# Patient Record
Sex: Male | Born: 1955 | Race: White | Hispanic: No | Marital: Married | Smoking: Current every day smoker
Health system: Southern US, Community
[De-identification: ages and names within clinical notes are randomized; demographics above are authoritative.]

## PROBLEM LIST (undated history)

## (undated) DIAGNOSIS — C801 Malignant (primary) neoplasm, unspecified: Secondary | ICD-10-CM

## (undated) DIAGNOSIS — C189 Malignant neoplasm of colon, unspecified: Secondary | ICD-10-CM

## (undated) DIAGNOSIS — J439 Emphysema, unspecified: Secondary | ICD-10-CM

## (undated) DIAGNOSIS — E119 Type 2 diabetes mellitus without complications: Secondary | ICD-10-CM

## (undated) DIAGNOSIS — D510 Vitamin B12 deficiency anemia due to intrinsic factor deficiency: Secondary | ICD-10-CM

## (undated) DIAGNOSIS — J189 Pneumonia, unspecified organism: Secondary | ICD-10-CM

## (undated) HISTORY — PX: OTHER SURGICAL HISTORY: SHX169

## (undated) HISTORY — PX: TONSILLECTOMY: SUR1361

## (undated) HISTORY — PX: BASAL CELL CARCINOMA EXCISION: SHX1214

## (undated) HISTORY — DX: Malignant (primary) neoplasm, unspecified: C80.1

---

## 2012-05-02 HISTORY — PX: BACK SURGERY: SHX140

## 2012-06-06 ENCOUNTER — Ambulatory Visit: Payer: Self-pay | Admitting: Family Medicine

## 2015-06-09 ENCOUNTER — Other Ambulatory Visit: Payer: Self-pay | Admitting: Neurosurgery

## 2015-06-09 DIAGNOSIS — M5021 Other cervical disc displacement,  high cervical region: Secondary | ICD-10-CM

## 2015-06-19 ENCOUNTER — Ambulatory Visit
Admission: RE | Admit: 2015-06-19 | Discharge: 2015-06-19 | Disposition: A | Discharge status: Home / Self Care | Payer: Medicare Other | Source: Ambulatory Visit | Attending: Neurosurgery | Admitting: Neurosurgery

## 2015-06-19 DIAGNOSIS — M5021 Other cervical disc displacement,  high cervical region: Secondary | ICD-10-CM

## 2017-03-27 DIAGNOSIS — J189 Pneumonia, unspecified organism: Secondary | ICD-10-CM | POA: Diagnosis not present

## 2017-04-17 DIAGNOSIS — Z6834 Body mass index (BMI) 34.0-34.9, adult: Secondary | ICD-10-CM | POA: Diagnosis not present

## 2017-04-17 DIAGNOSIS — R030 Elevated blood-pressure reading, without diagnosis of hypertension: Secondary | ICD-10-CM | POA: Diagnosis not present

## 2017-04-17 DIAGNOSIS — G5601 Carpal tunnel syndrome, right upper limb: Secondary | ICD-10-CM | POA: Diagnosis not present

## 2017-04-17 DIAGNOSIS — M9981 Other biomechanical lesions of cervical region: Secondary | ICD-10-CM | POA: Diagnosis not present

## 2017-05-16 DIAGNOSIS — H524 Presbyopia: Secondary | ICD-10-CM | POA: Diagnosis not present

## 2017-05-18 DIAGNOSIS — M654 Radial styloid tenosynovitis [de Quervain]: Secondary | ICD-10-CM | POA: Diagnosis not present

## 2017-05-30 DIAGNOSIS — R05 Cough: Secondary | ICD-10-CM | POA: Diagnosis not present

## 2017-05-30 DIAGNOSIS — J209 Acute bronchitis, unspecified: Secondary | ICD-10-CM | POA: Diagnosis not present

## 2017-10-16 DIAGNOSIS — Z6833 Body mass index (BMI) 33.0-33.9, adult: Secondary | ICD-10-CM | POA: Diagnosis not present

## 2017-10-16 DIAGNOSIS — M9981 Other biomechanical lesions of cervical region: Secondary | ICD-10-CM | POA: Diagnosis not present

## 2017-10-16 DIAGNOSIS — R030 Elevated blood-pressure reading, without diagnosis of hypertension: Secondary | ICD-10-CM | POA: Diagnosis not present

## 2018-02-19 DIAGNOSIS — Z79899 Other long term (current) drug therapy: Secondary | ICD-10-CM | POA: Diagnosis not present

## 2018-02-19 DIAGNOSIS — J9811 Atelectasis: Secondary | ICD-10-CM | POA: Diagnosis not present

## 2018-02-19 DIAGNOSIS — R490 Dysphonia: Secondary | ICD-10-CM | POA: Insufficient documentation

## 2018-02-19 DIAGNOSIS — F1911 Other psychoactive substance abuse, in remission: Secondary | ICD-10-CM | POA: Insufficient documentation

## 2018-02-19 DIAGNOSIS — R0609 Other forms of dyspnea: Secondary | ICD-10-CM | POA: Diagnosis not present

## 2018-02-19 DIAGNOSIS — Z0000 Encounter for general adult medical examination without abnormal findings: Secondary | ICD-10-CM | POA: Diagnosis not present

## 2018-02-19 DIAGNOSIS — M5116 Intervertebral disc disorders with radiculopathy, lumbar region: Secondary | ICD-10-CM

## 2018-02-19 DIAGNOSIS — E538 Deficiency of other specified B group vitamins: Secondary | ICD-10-CM | POA: Diagnosis not present

## 2018-02-19 DIAGNOSIS — Z720 Tobacco use: Secondary | ICD-10-CM | POA: Insufficient documentation

## 2018-02-19 DIAGNOSIS — Z125 Encounter for screening for malignant neoplasm of prostate: Secondary | ICD-10-CM | POA: Diagnosis not present

## 2018-02-19 HISTORY — DX: Intervertebral disc disorders with radiculopathy, lumbar region: M51.16

## 2018-03-02 DIAGNOSIS — C44519 Basal cell carcinoma of skin of other part of trunk: Secondary | ICD-10-CM | POA: Diagnosis not present

## 2018-03-02 DIAGNOSIS — A630 Anogenital (venereal) warts: Secondary | ICD-10-CM | POA: Diagnosis not present

## 2018-03-02 DIAGNOSIS — L570 Actinic keratosis: Secondary | ICD-10-CM | POA: Diagnosis not present

## 2018-03-02 DIAGNOSIS — D485 Neoplasm of uncertain behavior of skin: Secondary | ICD-10-CM | POA: Diagnosis not present

## 2018-03-02 DIAGNOSIS — X32XXXA Exposure to sunlight, initial encounter: Secondary | ICD-10-CM | POA: Diagnosis not present

## 2018-04-03 DIAGNOSIS — C44519 Basal cell carcinoma of skin of other part of trunk: Secondary | ICD-10-CM | POA: Diagnosis not present

## 2018-04-04 DIAGNOSIS — M5116 Intervertebral disc disorders with radiculopathy, lumbar region: Secondary | ICD-10-CM | POA: Diagnosis not present

## 2018-04-04 DIAGNOSIS — E538 Deficiency of other specified B group vitamins: Secondary | ICD-10-CM | POA: Diagnosis not present

## 2018-04-09 DIAGNOSIS — T819XXA Unspecified complication of procedure, initial encounter: Secondary | ICD-10-CM | POA: Diagnosis not present

## 2018-04-16 ENCOUNTER — Ambulatory Visit (INDEPENDENT_AMBULATORY_CARE_PROVIDER_SITE_OTHER): Payer: PPO | Admitting: Urology

## 2018-04-16 ENCOUNTER — Encounter: Payer: Self-pay | Admitting: Urology

## 2018-04-16 VITALS — BP 143/91 | HR 86 | Ht 69.0 in | Wt 217.8 lb

## 2018-04-16 DIAGNOSIS — A630 Anogenital (venereal) warts: Secondary | ICD-10-CM

## 2018-04-16 NOTE — Progress Notes (Signed)
04/16/2018  12:59 PM   Marin Comment April 23, 1956 [id number]  Referring provider: Rusty Aus, MD [facility] [place], [place] [address]  Chief Complaint  Patient presents with  . Establish Care  . Anogenital warts    HPI: Craig Anderson is a 62 y.o. male that presents today in consultation at request of Dr. Sabra Heck for evaluation of genital warts.  - Reports their location as base of penis into thighs  - Self treated them for years but would like to have them treated completely  - No voiding complaints  PMH: Past Medical History:  Diagnosis Date  . Carcinoma ([place])    basil cell   Surgical History: Past Surgical History:  Procedure Laterality Date  . BACK SURGERY  2014  . BASAL CELL CARCINOMA EXCISION    . vocal chord     tumor removal   Home Medications:  Allergies as of 04/16/2018   No Known Allergies     Medication List       Accurate as of April 16, 2018 12:59 PM. Always use your most recent med list.        aspirin EC 81 MG tablet Take by mouth daily.   doxycycline 100 MG capsule Commonly known as:  VIBRAMYCIN Take 100 mg by mouth.   DULoxetine 30 MG capsule Commonly known as:  CYMBALTA Take by mouth daily.   MULTI-VITAMINS Tabs Take by mouth daily.   Potassium 99 MG Tabs Take by mouth daily.   vitamin B-12 1000 MCG tablet Commonly known as:  CYANOCOBALAMIN Take by mouth daily.      Allergies: No Known Allergies  Family History: Family History  Problem Relation Age of Onset  . Kidney cancer Neg Hx   . Kidney failure Neg Hx   . Prostate cancer Neg Hx   . Sickle cell anemia Neg Hx   . Tuberculosis Neg Hx    Social History:  reports that he has quit smoking. He does not have any smokeless tobacco history on file. No history on file for alcohol and drug.  ROS: UROLOGY Frequent Urination?: No Hard to postpone urination?: No Burning/pain with urination?: No Get up at night  to urinate?: Yes Leakage of urine?: No Urine stream starts and stops?: No Trouble starting stream?: No Do you have to strain to urinate?: No Blood in urine?: No Urinary tract infection?: No Sexually transmitted disease?: No Injury to kidneys or bladder?: No Painful intercourse?: No Weak stream?: No Erection problems?: No Penile pain?: No  Gastrointestinal Nausea?: No Vomiting?: No Indigestion/heartburn?: No Diarrhea?: No Constipation?: No  Constitutional Fever: No Night sweats?: No Weight loss?: No Fatigue?: No  Skin Skin rash/lesions?: No Itching?: No  Eyes Blurred vision?: No Double vision?: No  Ears/Nose/Throat Sore throat?: No Sinus problems?: No  Hematologic/Lymphatic Swollen glands?: No Easy bruising?: No  Cardiovascular Leg swelling?: No Chest pain?: No  Respiratory Cough?: No Shortness of breath?: No  Endocrine Excessive thirst?: No  Musculoskeletal Back pain?: No Joint pain?: No  Neurological Headaches?: No Dizziness?: No  Psychologic Depression?: No Anxiety?: No  Physical Exam: BP (!) 143/91 (BP Location: Left Arm, Patient Position: Sitting)   Pulse 86   Ht 5\' 9"  (1.753 m)   Wt 217 lb 12.8 oz (98.8 kg)   BMI 32.16 kg/m   Constitutional:  Alert and oriented, No acute distress. Respiratory: Normal respiratory effort, no increased work of breathing. Clear CV:RRR GU: No CVA tenderness. Uncircumcised phallus. 10-mm pedunculated condylated distal  prepuce  2 additional 5-mm inner prepuce. 5-mm lesion at the frenulum. Multiple bilateral inner thigh condyloma Skin: No rashes, bruises or suspicious lesions. Neurologic: Grossly intact, no focal deficits, moving all 4 extremities. Psychiatric: Normal mood and affect.  Assessment & Plan:    1. Anogenital Warts  - Discussed excision for the warts on his prepuce v. circumcision. Discussed laser ablation for warts on thighs.   - Discussed recurrence with treatment and possible need to  spot treat with dermatology in the future   - Patient would like to schedule excision and ablation.  -Procedure discussed including potential risks of bleeding, infection and the likelihood of recurrent condylomata.  All questions were answered.   Abbie Sons, MD  [facility] [address] [place], [place] [address] [phone]   10-minute discussion with patient regarding treatment options, risks and benefits. The patient had few questions and concerns regarding these options and the long-term effects.  I, [name] , am acting as a scribe for [employer], Ronda Fairly, MD  I, Abbie Sons, MD, have reviewed all documentation for this visit. The documentation on 04/16/18 for the exam, diagnosis, procedures, and orders are all accurate and complete.

## 2018-04-16 NOTE — H&P (View-Only) (Signed)
04/16/2018  12:59 PM   Craig Anderson 1956-02-09 [id number]  Referring provider: Rusty Aus, MD [facility] [place], [place] [address]  Chief Complaint  Patient presents with  . Establish Care  . Anogenital warts    HPI: Craig Anderson is a 62 y.o. male that presents today in consultation at request of Dr. Sabra Heck for evaluation of genital warts.  - Reports their location as base of penis into thighs  - Self treated them for years but would like to have them treated completely  - No voiding complaints  PMH: Past Medical History:  Diagnosis Date  . Carcinoma ([place])    basil cell   Surgical History: Past Surgical History:  Procedure Laterality Date  . BACK SURGERY  2014  . BASAL CELL CARCINOMA EXCISION    . vocal chord     tumor removal   Home Medications:  Allergies as of 04/16/2018   No Known Allergies     Medication List       Accurate as of April 16, 2018 12:59 PM. Always use your most recent med list.        aspirin EC 81 MG tablet Take by mouth daily.   doxycycline 100 MG capsule Commonly known as:  VIBRAMYCIN Take 100 mg by mouth.   DULoxetine 30 MG capsule Commonly known as:  CYMBALTA Take by mouth daily.   MULTI-VITAMINS Tabs Take by mouth daily.   Potassium 99 MG Tabs Take by mouth daily.   vitamin B-12 1000 MCG tablet Commonly known as:  CYANOCOBALAMIN Take by mouth daily.      Allergies: No Known Allergies  Family History: Family History  Problem Relation Age of Onset  . Kidney cancer Neg Hx   . Kidney failure Neg Hx   . Prostate cancer Neg Hx   . Sickle cell anemia Neg Hx   . Tuberculosis Neg Hx    Social History:  reports that he has quit smoking. He does not have any smokeless tobacco history on file. No history on file for alcohol and drug.  ROS: UROLOGY Frequent Urination?: No Hard to postpone urination?: No Burning/pain with urination?: No Get up at night  to urinate?: Yes Leakage of urine?: No Urine stream starts and stops?: No Trouble starting stream?: No Do you have to strain to urinate?: No Blood in urine?: No Urinary tract infection?: No Sexually transmitted disease?: No Injury to kidneys or bladder?: No Painful intercourse?: No Weak stream?: No Erection problems?: No Penile pain?: No  Gastrointestinal Nausea?: No Vomiting?: No Indigestion/heartburn?: No Diarrhea?: No Constipation?: No  Constitutional Fever: No Night sweats?: No Weight loss?: No Fatigue?: No  Skin Skin rash/lesions?: No Itching?: No  Eyes Blurred vision?: No Double vision?: No  Ears/Nose/Throat Sore throat?: No Sinus problems?: No  Hematologic/Lymphatic Swollen glands?: No Easy bruising?: No  Cardiovascular Leg swelling?: No Chest pain?: No  Respiratory Cough?: No Shortness of breath?: No  Endocrine Excessive thirst?: No  Musculoskeletal Back pain?: No Joint pain?: No  Neurological Headaches?: No Dizziness?: No  Psychologic Depression?: No Anxiety?: No  Physical Exam: BP (!) 143/91 (BP Location: Left Arm, Patient Position: Sitting)   Pulse 86   Ht 5\' 9"  (1.753 m)   Wt 217 lb 12.8 oz (98.8 kg)   BMI 32.16 kg/m   Constitutional:  Alert and oriented, No acute distress. Respiratory: Normal respiratory effort, no increased work of breathing. Clear CV:RRR GU: No CVA tenderness. Uncircumcised phallus. 10-mm pedunculated condylated distal  prepuce  2 additional 5-mm inner prepuce. 5-mm lesion at the frenulum. Multiple bilateral inner thigh condyloma Skin: No rashes, bruises or suspicious lesions. Neurologic: Grossly intact, no focal deficits, moving all 4 extremities. Psychiatric: Normal mood and affect.  Assessment & Plan:    1. Anogenital Warts  - Discussed excision for the warts on his prepuce v. circumcision. Discussed laser ablation for warts on thighs.   - Discussed recurrence with treatment and possible need to  spot treat with dermatology in the future   - Patient would like to schedule excision and ablation.  -Procedure discussed including potential risks of bleeding, infection and the likelihood of recurrent condylomata.  All questions were answered.   Abbie Sons, MD  [facility] [address], [place], [place] [address] [phone]   10-minute discussion with patient regarding treatment options, risks and benefits. The patient had few questions and concerns regarding these options and the long-term effects.  I, [name] , am acting as a scribe for [employer], Ronda Fairly, MD  I, Abbie Sons, MD, have reviewed all documentation for this visit. The documentation on 04/16/18 for the exam, diagnosis, procedures, and orders are all accurate and complete.

## 2018-04-20 ENCOUNTER — Telehealth: Payer: Self-pay | Admitting: Radiology

## 2018-04-20 ENCOUNTER — Other Ambulatory Visit: Payer: Self-pay | Admitting: Radiology

## 2018-04-20 DIAGNOSIS — A630 Anogenital (venereal) warts: Secondary | ICD-10-CM

## 2018-04-20 NOTE — Telephone Encounter (Signed)
Discussed the [facility] Information form below with patient over the phone.  [place], [place], [place] [address] Telephone: [phone] Fax: [phone]   Thank you for choosing [facility] for your upcoming surgery!  We are always here to assist in your urological needs.  Please read the following information with specific details for your upcoming appointments related to your surgery. Please contact Ily Denno at [phone] Option 3 with any questions.  The Name of Your Surgery: Escision of penile condylomata, CO2 laser ablation of condyloma of inner thigh Your Surgery Date: 05/08/2018 Your Surgeon: John Giovanni  Please call Same Day Surgery at [phone] between the hours of 1pm-3pm one day prior to your surgery. They will inform you of the time to arrive at Same Day Surgery which is located on the second floor of the [facility].   You will receive a call from the [place] office regarding your appointment with them.  The Pre-Admission Testing office is located at [facility], on the first floor of the [place] [facility] in [place] (office is to the right as you enter through the [employer] of the [facility]). Please have all medications you are currently taking and your insurance card available.   Patient was advised to have nothing to eat or drink after midnight the night prior to surgery except that he may have only water until 2 hours before surgery with nothing to drink within 2 hours of surgery.  The patient states he currently takes aspirin 81mg  intermittently & was informed to hold medication for 7 days prior to surgery beginning on 05/01/2018. Patient's questions were answered and he expressed understanding of these instructions.

## 2018-05-02 HISTORY — PX: PORTACATH PLACEMENT: SHX2246

## 2018-05-03 ENCOUNTER — Encounter
Admission: RE | Admit: 2018-05-03 | Discharge: 2018-05-03 | Disposition: A | Discharge status: Home / Self Care | Payer: PPO | Source: Ambulatory Visit | Attending: Urology | Admitting: Urology

## 2018-05-03 ENCOUNTER — Other Ambulatory Visit: Payer: Self-pay

## 2018-05-03 HISTORY — DX: Pneumonia, unspecified organism: J18.9

## 2018-05-03 NOTE — Patient Instructions (Addendum)
  Your procedure is scheduled on: Tuesday May 08, 2018 Report to Same Day Surgery 2nd floor [facility] Entrance-take elevator on left to 2nd floor.  Check in with surgery information desk.) To find out your arrival time, call [phone] 1:00-3:00 PM on Monday May 07, 2018  Remember: Instructions that are not followed completely may result in serious medical risk, up to and including death, or upon the discretion of your surgeon and anesthesiologist your surgery may need to be rescheduled.    __x__ 1. Do not eat food (including mints, candies, chewing gum) after midnight the night before your procedure. You may drink water up to 2 hours before you are scheduled to arrive at the hospital for your procedure.  Do not drink anything within 2 hours of your scheduled arrival to the hospital.    __x__ 2. No Alcohol for 24 hours before or after surgery.   __x__ 3. No Smoking or e-cigarettes for 24 hours before surgery.  Do not use any chewable tobacco products for at least 6 hours before surgery.   __x__ 4. Notify your doctor if there is any change in your medical condition (cold, fever, infections).   __x__ 5. On the morning of surgery brush your teeth with toothpaste and water.  You may rinse your mouth with mouthwash if you wish.  Do not swallow any toothpaste or mouthwash.   __x__  Use antibacterial soap such as Dial to shower/bathe on the day of surgery.   Do not wear jewelry on the day of surgery.  Do not wear lotions, powders, deodorant, or perfumes.   Do not shave below the face/neck 48 hours prior to surgery.   Do not bring valuables to the hospital.    [facility] is not responsible for any belongings or valuables.               Glasses, contacts, dentures or bridgework may not be worn into surgery.  For patients discharged on the day of surgery, you will NOT be permitted to drive yourself home.  You must have a responsible adult with you for 24 hours after  surgery.  __x__ Take these medications with a SMALL SIP OF WATER on the morning of surgery:  1. Duloxetine (Cymbalta)  __x__ Follow recommendations from Cardiologist, Pulmonologist or PCP regarding stopping blood thinners such as Aspirin, Coumadin, Plavix, Eliquis, Effient, Pradaxa, and Pletal.  __x__ Stop Anti-inflammatories such as Advil, Ibuprofen, Motrin, Aleve, Naproxen, Naprosyn, BC/Goodies powders or aspirin products. You may continue to take Tylenol and Celebrex. (Already stopped aspirin 04/30/2018)   __x__  Avoid supplements until after surgery. You may continue to take Vitamin D, Vitamin B, and multivitamin.

## 2018-05-07 MED ORDER — CEFAZOLIN SODIUM-DEXTROSE 2-4 GM/100ML-% IV SOLN
2.0000 g | INTRAVENOUS | Status: AC
  Administered 2018-05-08: 2 g via INTRAVENOUS

## 2018-05-08 ENCOUNTER — Ambulatory Visit: Payer: PPO | Admitting: Anesthesiology

## 2018-05-08 ENCOUNTER — Encounter: Payer: Self-pay | Admitting: *Deleted

## 2018-05-08 ENCOUNTER — Ambulatory Visit
Admission: RE | Admit: 2018-05-08 | Discharge: 2018-05-08 | Disposition: A | Discharge status: Home / Self Care | Payer: PPO | Attending: Urology | Admitting: Urology

## 2018-05-08 ENCOUNTER — Telehealth: Payer: Self-pay | Admitting: Urology

## 2018-05-08 ENCOUNTER — Encounter
Admission: RE | Disposition: A | Discharge status: Home / Self Care | Payer: Self-pay | Source: Home / Self Care | Attending: Urology

## 2018-05-08 ENCOUNTER — Other Ambulatory Visit: Payer: Self-pay

## 2018-05-08 DIAGNOSIS — Z87891 Personal history of nicotine dependence: Secondary | ICD-10-CM | POA: Insufficient documentation

## 2018-05-08 DIAGNOSIS — Z79899 Other long term (current) drug therapy: Secondary | ICD-10-CM | POA: Diagnosis not present

## 2018-05-08 DIAGNOSIS — Z85828 Personal history of other malignant neoplasm of skin: Secondary | ICD-10-CM | POA: Insufficient documentation

## 2018-05-08 DIAGNOSIS — Z7982 Long term (current) use of aspirin: Secondary | ICD-10-CM | POA: Diagnosis not present

## 2018-05-08 DIAGNOSIS — A630 Anogenital (venereal) warts: Secondary | ICD-10-CM | POA: Diagnosis not present

## 2018-05-08 HISTORY — PX: CO2 LASER APPLICATION: SHX5778

## 2018-05-08 HISTORY — PX: CONDYLOMA EXCISION/FULGURATION: SHX1389

## 2018-05-08 LAB — URINE DRUG SCREEN, QUALITATIVE (ARMC ONLY)
AMPHETAMINES, UR SCREEN: NOT DETECTED
Barbiturates, Ur Screen: NOT DETECTED
Benzodiazepine, Ur Scrn: NOT DETECTED
Cannabinoid 50 Ng, Ur ~~LOC~~: NOT DETECTED
Cocaine Metabolite,Ur ~~LOC~~: NOT DETECTED
MDMA (Ecstasy)Ur Screen: NOT DETECTED
Methadone Scn, Ur: NOT DETECTED
Opiate, Ur Screen: NOT DETECTED
PHENCYCLIDINE (PCP) UR S: NOT DETECTED
Tricyclic, Ur Screen: NOT DETECTED

## 2018-05-08 SURGERY — REMOVAL, CONDYLOMA
Anesthesia: General | Site: Thigh

## 2018-05-08 MED ORDER — PROPOFOL 10 MG/ML IV BOLUS
INTRAVENOUS | Status: AC
  Filled 2018-05-08: qty 20

## 2018-05-08 MED ORDER — HYDROCODONE-ACETAMINOPHEN 5-325 MG PO TABS: 1.0000 | ORAL_TABLET | ORAL | 0 refills | Status: DC | PRN

## 2018-05-08 MED ORDER — ONDANSETRON HCL 4 MG/2ML IJ SOLN
INTRAMUSCULAR | Status: DC | PRN
  Administered 2018-05-08: 4 mg via INTRAVENOUS

## 2018-05-08 MED ORDER — MIDAZOLAM HCL 2 MG/2ML IJ SOLN
INTRAMUSCULAR | Status: AC
  Filled 2018-05-08: qty 2

## 2018-05-08 MED ORDER — LACTATED RINGERS IV SOLN: INTRAVENOUS | Status: DC

## 2018-05-08 MED ORDER — FENTANYL CITRATE (PF) 100 MCG/2ML IJ SOLN
25.0000 ug | INTRAMUSCULAR | Status: DC | PRN
  Administered 2018-05-08 (×2): 25 ug via INTRAVENOUS

## 2018-05-08 MED ORDER — DEXAMETHASONE SODIUM PHOSPHATE 10 MG/ML IJ SOLN
INTRAMUSCULAR | Status: DC | PRN
  Administered 2018-05-08: 10 mg via INTRAVENOUS

## 2018-05-08 MED ORDER — ONDANSETRON HCL 4 MG/2ML IJ SOLN: 4.0000 mg | Freq: Once | INTRAMUSCULAR | Status: DC | PRN

## 2018-05-08 MED ORDER — CEFAZOLIN SODIUM-DEXTROSE 2-4 GM/100ML-% IV SOLN
INTRAVENOUS | Status: AC
  Filled 2018-05-08: qty 100

## 2018-05-08 MED ORDER — LIDOCAINE HCL (PF) 2 % IJ SOLN
INTRAMUSCULAR | Status: AC
  Filled 2018-05-08: qty 10

## 2018-05-08 MED ORDER — FAMOTIDINE 20 MG PO TABS
ORAL_TABLET | ORAL | Status: AC
  Filled 2018-05-08: qty 1

## 2018-05-08 MED ORDER — FENTANYL CITRATE (PF) 100 MCG/2ML IJ SOLN
INTRAMUSCULAR | Status: AC
  Filled 2018-05-08: qty 2

## 2018-05-08 MED ORDER — HYDROMORPHONE HCL 1 MG/ML IJ SOLN
INTRAMUSCULAR | Status: DC | PRN
  Administered 2018-05-08 (×2): 0.5 mg via INTRAVENOUS

## 2018-05-08 MED ORDER — DEXMEDETOMIDINE HCL 200 MCG/2ML IV SOLN
INTRAVENOUS | Status: DC | PRN
  Administered 2018-05-08: 16 ug via INTRAVENOUS

## 2018-05-08 MED ORDER — HYDROMORPHONE HCL 1 MG/ML IJ SOLN
INTRAMUSCULAR | Status: AC
  Filled 2018-05-08: qty 1

## 2018-05-08 MED ORDER — BACITRACIN ZINC 500 UNIT/GM EX OINT
TOPICAL_OINTMENT | CUTANEOUS | Status: AC
  Filled 2018-05-08: qty 28.35

## 2018-05-08 MED ORDER — FAMOTIDINE 20 MG PO TABS: 20.0000 mg | ORAL_TABLET | Freq: Once | ORAL | Status: DC

## 2018-05-08 MED ORDER — FENTANYL CITRATE (PF) 100 MCG/2ML IJ SOLN
INTRAMUSCULAR | Status: AC
  Administered 2018-05-08: 25 ug via INTRAVENOUS
  Filled 2018-05-08: qty 2

## 2018-05-08 MED ORDER — PROPOFOL 10 MG/ML IV BOLUS
INTRAVENOUS | Status: DC | PRN
  Administered 2018-05-08: 150 mg via INTRAVENOUS
  Administered 2018-05-08: 50 mg via INTRAVENOUS

## 2018-05-08 MED ORDER — LIDOCAINE HCL (CARDIAC) PF 100 MG/5ML IV SOSY
PREFILLED_SYRINGE | INTRAVENOUS | Status: DC | PRN
  Administered 2018-05-08: 60 mg via INTRAVENOUS

## 2018-05-08 MED ORDER — LACTATED RINGERS IV SOLN
INTRAVENOUS | Status: DC | PRN
  Administered 2018-05-08: 09:00:00 via INTRAVENOUS

## 2018-05-08 MED ORDER — MIDAZOLAM HCL 2 MG/2ML IJ SOLN
INTRAMUSCULAR | Status: DC | PRN
  Administered 2018-05-08: 2 mg via INTRAVENOUS

## 2018-05-08 SURGICAL SUPPLY — 22 items
BASIN GRAD PLASTIC 32OZ STRL (MISCELLANEOUS) ×3 IMPLANT
CANISTER SUCT 1200ML W/VALVE (MISCELLANEOUS) ×3 IMPLANT
COVER WAND RF STERILE (DRAPES) ×3 IMPLANT
DRAPE CO2 STERILE (DRAPES) ×1 IMPLANT
DRAPE LAPAROTOMY 100X77 ABD (DRAPES) ×3 IMPLANT
DRAPE TABLE BACK 80X90 (DRAPES) ×3 IMPLANT
EVACUATOR AND FILTER SMOKE CO2 (MISCELLANEOUS) ×1 IMPLANT
FEE NO ADDITIONAL CHARGE (MISCELLANEOUS) IMPLANT
GAUZE PETROLATUM 1 X8 (GAUZE/BANDAGES/DRESSINGS) ×3 IMPLANT
GAUZE SPONGE 4X4 12PLY STRL (GAUZE/BANDAGES/DRESSINGS) ×3 IMPLANT
GLOVE BIO SURGEON STRL SZ8 (GLOVE) ×3 IMPLANT
GOWN STRL REUS W/ TWL LRG LVL3 (GOWN DISPOSABLE) ×4 IMPLANT
GOWN STRL REUS W/ TWL XL LVL3 (GOWN DISPOSABLE) ×2 IMPLANT
GOWN STRL REUS W/TWL LRG LVL3 (GOWN DISPOSABLE) ×2
GOWN STRL REUS W/TWL XL LVL3 (GOWN DISPOSABLE) ×1
GRADUATE 1200CC STRL 31836 (MISCELLANEOUS) ×3 IMPLANT
KIT TURNOVER KIT A (KITS) ×3 IMPLANT
LASER CO2 ACUPULSE DUO (Laser) ×1 IMPLANT
NO ADDITIONAL CHARGE (MISCELLANEOUS) ×3 IMPLANT
TUBING CONNECTING 10 (TUBING) ×3 IMPLANT
TUBING SMOKE CO2 EVAC STRL (MISCELLANEOUS) ×1 IMPLANT
WATER STERILE IRR 1000ML POUR (IV SOLUTION) ×3 IMPLANT

## 2018-05-08 NOTE — Interval H&P Note (Signed)
History and Physical Interval Note:  05/08/2018 8:12 AM  Craig Anderson  has presented today for surgery, with the diagnosis of condylomata of penis and inner thigh  The various methods of treatment have been discussed with the patient and family. After consideration of risks, benefits and other options for treatment, the patient has consented to  Procedure(s): Excision of penile CONDYLOMA, CO2 laser ablation of condyloma of inner thighs (N/A) CO2 LASER APPLICATION for ablation of condyloma of inner thighs (Bilateral) as a surgical intervention .  The patient's history has been reviewed, patient examined, no change in status, stable for surgery.  I have reviewed the patient's chart and labs.  Questions were answered to the patient's satisfaction.     [place]

## 2018-05-08 NOTE — Anesthesia Preprocedure Evaluation (Signed)
Anesthesia Evaluation  Patient identified by MRN, date of birth, ID band Patient awake    Reviewed: Allergy & Precautions, H&P , NPO status , Patient's Chart, lab work & pertinent test results, reviewed documented beta blocker date and time   Airway Mallampati: III  TM Distance: >3 FB Neck ROM: full    Dental  (+) Edentulous Upper, Edentulous Lower   Pulmonary neg pulmonary ROS, pneumonia, resolved, Current Smoker,    Pulmonary exam normal        Cardiovascular Exercise Tolerance: Good negative cardio ROS Normal cardiovascular exam Rate:Normal     Neuro/Psych negative neurological ROS  negative psych ROS   GI/Hepatic negative GI ROS, Neg liver ROS,   Endo/Other  negative endocrine ROS  Renal/GU negative Renal ROS  negative genitourinary   Musculoskeletal   Abdominal   Peds  Hematology negative hematology ROS (+)   Anesthesia Other Findings   Reproductive/Obstetrics negative OB ROS                             Anesthesia Physical Anesthesia Plan  ASA: II  Anesthesia Plan: General LMA   Post-op Pain Management:    Induction:   PONV Risk Score and Plan:   Airway Management Planned:   Additional Equipment:   Intra-op Plan:   Post-operative Plan:   Informed Consent: I have reviewed the patients History and Physical, chart, labs and discussed the procedure including the risks, benefits and alternatives for the proposed anesthesia with the patient or authorized representative who has indicated his/her understanding and acceptance.     Plan Discussed with: CRNA  Anesthesia Plan Comments:         Anesthesia Quick Evaluation

## 2018-05-08 NOTE — Discharge Instructions (Signed)

## 2018-05-08 NOTE — Telephone Encounter (Signed)
He had surgery today and will need the hydrocodone that was prescribed.

## 2018-05-08 NOTE — Telephone Encounter (Signed)
[facility] called and needs a call back regarding a prescription that was called in for this patient. They also wanted you to be aware that he was just given a script for Tramadol on 05-06-18 and wanted to make sure that you were ok with then filling the pain meds you prescribed him.   Please advise  Sharyn Lull

## 2018-05-08 NOTE — Anesthesia Post-op Follow-up Note (Signed)
Anesthesia QCDR form completed.        

## 2018-05-08 NOTE — Op Note (Signed)
Preoperative diagnosis:  1. Condyloma acuminata external genitalia/medial thigh  Postoperative diagnosis:  1. Same  Procedure: 1. Excision penile condylomata 2. Laser vaporization scrotal/medial thigh condyloma  Surgeon: Abbie Sons, MD  Anesthesia: General  Complications: None  Intraoperative findings: 1.  Multiple condylomata medial thigh bilaterally largest approximately 1 cm  2.  Scattered scrotal condyloma largest approximately 2 mm  3.  15 mm exophytic condyloma outer distal prepuce; Three 3-5 mm condyloma inner prepuce and frenulum  EBL: Minimal  Specimens: None  Indication: Craig Anderson is a 62 y.o. patient with multiple penile, scrotal and inner thigh condyloma.  After reviewing the management options for treatment, he elected to proceed with the above surgical procedure(s). We have discussed the potential benefits and risks of the procedure, side effects of the proposed treatment, the likelihood of the patient achieving the goals of the procedure, and any potential problems that might occur during the procedure or recuperation. Informed consent has been obtained.  Description of procedure:  The patient was taken to the operating room and general anesthesia was induced.  The patient was placed in the dorsal lithotomy position, prepped and draped in the usual sterile fashion, and preoperative antibiotics were administered. A preoperative time-out was performed.   The larger condyloma on the inner thigh were ablated with electrocautery.  The remaining condylomata were ablated with a CO2 laser at 65 W.    The scattered scrotal lesions were vaporized in a similar fashion.  Attention was then directed to the preputial lesions.  All lesions were tented with forceps and excised with baby Metzenbaum scissors.  Hemostasis was obtained with cautery.  The defects were then closed with interrupted 4-0 chromic suture.  The thigh sites were dressed with bacitracin  ointment and Telfa.   After anesthetic reversal he was transported to the PACU in stable condition.   Abbie Sons, M.D.

## 2018-05-08 NOTE — Telephone Encounter (Signed)
[facility].  Informed them to fill the hydrocodone per Dr Bernardo Heater since patient had surgery today.

## 2018-05-08 NOTE — Transfer of Care (Signed)
Immediate Anesthesia Transfer of Care Note  Patient: Craig Anderson  Procedure(s) Performed: Excision of penile CONDYLOMA, CO2 laser ablation of condyloma of inner thighs (N/A Penis) CO2 LASER APPLICATION for ablation of condyloma of inner thighs (Bilateral Thigh)  Patient Location: PACU  Anesthesia Type:General  Level of Consciousness: sedated  Airway & Oxygen Therapy: Patient Spontanous Breathing and Patient connected to face mask oxygen  Post-op Assessment: Report given to RN and Post -op Vital signs reviewed and stable  Post vital signs: Reviewed and stable  Last Vitals:  Vitals Value Taken Time  BP 112/80 05/08/2018 10:28 AM  Temp 36.2 C 05/08/2018 10:17 AM  Pulse 72 05/08/2018 10:29 AM  Resp 18 05/08/2018 10:29 AM  SpO2 96 % 05/08/2018 10:29 AM  Vitals shown include unvalidated device data.  Last Pain:  Vitals:   05/08/18 1017  TempSrc:   PainSc: 0-No pain         Complications: No apparent anesthesia complications

## 2018-05-08 NOTE — Anesthesia Procedure Notes (Signed)
Procedure Name: LMA Insertion Date/Time: 05/08/2018 8:59 AM Performed by: Justus Memory, CRNA Pre-anesthesia Checklist: Patient identified, Patient being monitored, Timeout performed, Emergency Drugs available and Suction available Patient Re-evaluated:Patient Re-evaluated prior to induction Oxygen Delivery Method: Circle system utilized Preoxygenation: Pre-oxygenation with 100% oxygen Induction Type: IV induction Ventilation: Mask ventilation without difficulty LMA: LMA inserted LMA Size: 4.5 Tube type: Oral Number of attempts: 1 Placement Confirmation: positive ETCO2 and breath sounds checked- equal and bilateral Tube secured with: Tape Dental Injury: Teeth and Oropharynx as per pre-operative assessment

## 2018-05-09 ENCOUNTER — Encounter: Payer: Self-pay | Admitting: Urology

## 2018-05-09 LAB — SURGICAL PATHOLOGY

## 2018-05-10 NOTE — Anesthesia Postprocedure Evaluation (Signed)
Anesthesia Post Note  Patient: JAIYDEN LAUR  Procedure(s) Performed: Excision of penile CONDYLOMA, CO2 laser ablation of condyloma of inner thighs (N/A Penis) CO2 LASER APPLICATION for ablation of condyloma of inner thighs (Bilateral Thigh)  Patient location during evaluation: PACU Anesthesia Type: General Level of consciousness: awake and alert Pain management: pain level controlled Vital Signs Assessment: post-procedure vital signs reviewed and stable Respiratory status: spontaneous breathing, nonlabored ventilation, respiratory function stable and patient connected to nasal cannula oxygen Cardiovascular status: blood pressure returned to baseline and stable Postop Assessment: no apparent nausea or vomiting Anesthetic complications: no     Last Vitals:  Vitals:   05/08/18 1106 05/08/18 1147  BP: (!) 144/86 (!) 148/77  Pulse: 76 65  Resp: 16 16  Temp: 36.5 C   SpO2: 97% 98%    Last Pain:  Vitals:   05/09/18 0859  TempSrc:   PainSc: 0-No pain                 Molli Barrows

## 2018-06-08 ENCOUNTER — Ambulatory Visit (INDEPENDENT_AMBULATORY_CARE_PROVIDER_SITE_OTHER): Payer: PPO | Admitting: Urology

## 2018-06-08 ENCOUNTER — Encounter: Payer: Self-pay | Admitting: Urology

## 2018-06-08 VITALS — BP 131/78 | HR 79 | Ht 69.0 in | Wt 217.0 lb

## 2018-06-08 DIAGNOSIS — Z09 Encounter for follow-up examination after completed treatment for conditions other than malignant neoplasm: Secondary | ICD-10-CM | POA: Diagnosis not present

## 2018-06-08 NOTE — Progress Notes (Signed)
06/08/2018 8:30 AM   Craig Anderson 1956-01-22 [id number]  Referring provider: Rusty Aus, MD [place] [facility] [place], [place] [address]  Chief Complaint  Patient presents with  . Routine Post Op    HPI: 63 year old male presents for postop follow-up.  Status post excision of penile condylomata and laser vaporization of multiple scrotal/medial thigh condylomata on 05/21/2018.  He had no postoperative problems and states he is doing well.  Pathology: Condyloma acuminata; negative for high-grade dysplasia/malignancy   PMH: Past Medical History:  Diagnosis Date  . Carcinoma (HCC)    basil cell  . Pneumonia     Surgical History: Past Surgical History:  Procedure Laterality Date  . BACK SURGERY  2014  . BASAL CELL CARCINOMA EXCISION    . CO2 LASER APPLICATION Bilateral 05/10/1476   Procedure: CO2 LASER APPLICATION for ablation of condyloma of inner thighs;  Surgeon: Abbie Sons, MD;  Location: [facility] ORS;  Service: Urology;  Laterality: Bilateral;  . CONDYLOMA EXCISION/FULGURATION N/A 05/08/2018   Procedure: Excision of penile CONDYLOMA, CO2 laser ablation of condyloma of inner thighs;  Surgeon: Abbie Sons, MD;  Location: [facility] ORS;  Service: Urology;  Laterality: N/A;  . vocal chord     tumor removal    Home Medications:  Allergies as of 06/08/2018   No Known Allergies     Medication List       Accurate as of June 08, 2018  8:30 AM. Always use your most recent med list.        aspirin EC 81 MG tablet Take 81 mg by mouth daily.   CHANTIX STARTING MONTH PAK 0.5 MG X 11 & 1 MG X 42 tablet Generic drug:  varenicline See admin instructions. see package   DULoxetine 30 MG capsule Commonly known as:  CYMBALTA Take 30 mg by mouth daily.   MULTI-VITAMINS Tabs Take 1 tablet by mouth daily.   Potassium 99 MG Tabs Take 99 mg by mouth daily.   traMADol 50 MG tablet Commonly known as:  ULTRAM Take 50 mg by  mouth every 6 (six) hours as needed.   vitamin B-12 1000 MCG tablet Commonly known as:  CYANOCOBALAMIN Take 1,000 mcg by mouth daily.       Allergies: No Known Allergies  Family History: Family History  Problem Relation Age of Onset  . Kidney cancer Neg Hx   . Kidney failure Neg Hx   . Prostate cancer Neg Hx   . Sickle cell anemia Neg Hx   . Tuberculosis Neg Hx     Social History:  reports that he has been smoking cigarettes. He has been smoking about 0.50 packs per day. He has never used smokeless tobacco. He reports previous alcohol use. He reports previous drug use.  ROS: No significant change from 04/16/2018 except as noted in the HPI  Physical Exam: BP 131/78 (BP Location: Left Arm, Patient Position: Sitting, Cuff Size: Normal)   Pulse 79   Ht 5\' 9"  (1.753 m)   Wt 217 lb (98.4 kg)   BMI 32.05 kg/m   Constitutional:  Alert and oriented, No acute distress. HEENT: [place] AT, moist mucus membranes.  Trachea midline, no masses. Cardiovascular: No clubbing, cyanosis, or edema. Respiratory: Normal respiratory effort, no increased work of breathing. GI: Abdomen is soft, nontender, nondistended, no abdominal masses GU: No CVA tenderness.  Excised condylomatous sites have healed completely.  No lesions noted.  Scrotal/inner thigh laser sites have healed. Lymph: No cervical  or inguinal lymphadenopathy. Skin: No rashes, bruises or suspicious lesions. Neurologic: Grossly intact, no focal deficits, moving all 4 extremities. Psychiatric: Normal mood and affect.  Assessment & Plan:   Doing well status post excision penile condylomata and CO2 laser ablation.  Asked him to monitor for recurrent lesions and for early recurrences would recommend liquid nitrogen therapy with dermatology.  Follow-up PRN  Abbie Sons, MD  [facility] [address], [place], [place] [address] [phone]

## 2018-10-25 DIAGNOSIS — Z125 Encounter for screening for malignant neoplasm of prostate: Secondary | ICD-10-CM | POA: Diagnosis not present

## 2018-10-25 DIAGNOSIS — M5116 Intervertebral disc disorders with radiculopathy, lumbar region: Secondary | ICD-10-CM | POA: Diagnosis not present

## 2018-10-25 DIAGNOSIS — J431 Panlobular emphysema: Secondary | ICD-10-CM | POA: Diagnosis not present

## 2018-10-25 DIAGNOSIS — D510 Vitamin B12 deficiency anemia due to intrinsic factor deficiency: Secondary | ICD-10-CM | POA: Diagnosis not present

## 2018-12-04 DIAGNOSIS — A084 Viral intestinal infection, unspecified: Secondary | ICD-10-CM | POA: Diagnosis not present

## 2018-12-04 DIAGNOSIS — R1084 Generalized abdominal pain: Secondary | ICD-10-CM | POA: Diagnosis not present

## 2018-12-17 ENCOUNTER — Emergency Department
Admission: EM | Admit: 2018-12-17 | Discharge: 2018-12-17 | Disposition: A | Discharge status: Home / Self Care | Payer: PPO | Attending: Emergency Medicine | Admitting: Emergency Medicine

## 2018-12-17 ENCOUNTER — Ambulatory Visit
Admission: RE | Admit: 2018-12-17 | Discharge: 2018-12-17 | Disposition: A | Discharge status: Home / Self Care | Payer: PPO | Source: Ambulatory Visit | Attending: Internal Medicine | Admitting: Internal Medicine

## 2018-12-17 ENCOUNTER — Other Ambulatory Visit (HOSPITAL_COMMUNITY): Payer: Self-pay | Admitting: Internal Medicine

## 2018-12-17 ENCOUNTER — Other Ambulatory Visit: Payer: Self-pay

## 2018-12-17 ENCOUNTER — Other Ambulatory Visit: Payer: Self-pay | Admitting: Internal Medicine

## 2018-12-17 ENCOUNTER — Encounter: Payer: Self-pay | Admitting: Emergency Medicine

## 2018-12-17 DIAGNOSIS — K639 Disease of intestine, unspecified: Secondary | ICD-10-CM | POA: Diagnosis not present

## 2018-12-17 DIAGNOSIS — Z7982 Long term (current) use of aspirin: Secondary | ICD-10-CM | POA: Diagnosis not present

## 2018-12-17 DIAGNOSIS — Z79899 Other long term (current) drug therapy: Secondary | ICD-10-CM | POA: Insufficient documentation

## 2018-12-17 DIAGNOSIS — R1084 Generalized abdominal pain: Secondary | ICD-10-CM

## 2018-12-17 DIAGNOSIS — R1030 Lower abdominal pain, unspecified: Secondary | ICD-10-CM | POA: Diagnosis present

## 2018-12-17 DIAGNOSIS — R1032 Left lower quadrant pain: Secondary | ICD-10-CM | POA: Diagnosis not present

## 2018-12-17 DIAGNOSIS — Z20828 Contact with and (suspected) exposure to other viral communicable diseases: Secondary | ICD-10-CM | POA: Diagnosis not present

## 2018-12-17 DIAGNOSIS — N138 Other obstructive and reflux uropathy: Secondary | ICD-10-CM | POA: Diagnosis not present

## 2018-12-17 DIAGNOSIS — R1031 Right lower quadrant pain: Secondary | ICD-10-CM | POA: Diagnosis not present

## 2018-12-17 DIAGNOSIS — Z03818 Encounter for observation for suspected exposure to other biological agents ruled out: Secondary | ICD-10-CM | POA: Diagnosis not present

## 2018-12-17 DIAGNOSIS — F17210 Nicotine dependence, cigarettes, uncomplicated: Secondary | ICD-10-CM | POA: Insufficient documentation

## 2018-12-17 DIAGNOSIS — N401 Enlarged prostate with lower urinary tract symptoms: Secondary | ICD-10-CM | POA: Diagnosis not present

## 2018-12-17 DIAGNOSIS — K6389 Other specified diseases of intestine: Secondary | ICD-10-CM | POA: Diagnosis not present

## 2018-12-17 DIAGNOSIS — R188 Other ascites: Secondary | ICD-10-CM | POA: Diagnosis not present

## 2018-12-17 LAB — CBC WITH DIFFERENTIAL/PLATELET
Abs Immature Granulocytes: 0.03 10*3/uL (ref 0.00–0.07)
Basophils Absolute: 0 10*3/uL (ref 0.0–0.1)
Basophils Relative: 0 %
Eosinophils Absolute: 0.1 10*3/uL (ref 0.0–0.5)
Eosinophils Relative: 1 %
HCT: 39.2 % (ref 39.0–52.0)
Hemoglobin: 13.1 g/dL (ref 13.0–17.0)
Immature Granulocytes: 0 %
Lymphocytes Relative: 27 %
Lymphs Abs: 2.4 10*3/uL (ref 0.7–4.0)
MCH: 30.1 pg (ref 26.0–34.0)
MCHC: 33.4 g/dL (ref 30.0–36.0)
MCV: 90.1 fL (ref 80.0–100.0)
Monocytes Absolute: 1.2 10*3/uL — ABNORMAL HIGH (ref 0.1–1.0)
Monocytes Relative: 13 %
Neutro Abs: 5.4 10*3/uL (ref 1.7–7.7)
Neutrophils Relative %: 59 %
Platelets: 264 10*3/uL (ref 150–400)
RBC: 4.35 MIL/uL (ref 4.22–5.81)
RDW: 12.5 % (ref 11.5–15.5)
WBC: 9.2 10*3/uL (ref 4.0–10.5)
nRBC: 0 % (ref 0.0–0.2)

## 2018-12-17 LAB — BASIC METABOLIC PANEL
Anion gap: 7 (ref 5–15)
BUN: 8 mg/dL (ref 8–23)
CO2: 28 mmol/L (ref 22–32)
Calcium: 8.5 mg/dL — ABNORMAL LOW (ref 8.9–10.3)
Chloride: 101 mmol/L (ref 98–111)
Creatinine, Ser: 0.83 mg/dL (ref 0.61–1.24)
GFR calc Af Amer: >60 mL/min (ref >60)
GFR calc non Af Amer: >60 mL/min (ref >60)
Glucose, Bld: 118 mg/dL — ABNORMAL HIGH (ref 70–99)
Potassium: 3.8 mmol/L (ref 3.5–5.1)
Sodium: 136 mmol/L (ref 135–145)

## 2018-12-17 LAB — HEPATIC FUNCTION PANEL
ALT: 16 U/L (ref 0–44)
AST: 15 U/L (ref 15–41)
Albumin: 3.6 g/dL (ref 3.5–5.0)
Alkaline Phosphatase: 50 U/L (ref 38–126)
Bilirubin, Direct: <0.1 mg/dL (ref 0.0–0.2)
Total Bilirubin: 0.5 mg/dL (ref 0.3–1.2)
Total Protein: 6.8 g/dL (ref 6.5–8.1)

## 2018-12-17 LAB — POCT I-STAT CREATININE: Creatinine, Ser: 0.9 mg/dL (ref 0.61–1.24)

## 2018-12-17 LAB — LIPASE, BLOOD: Lipase: 23 U/L (ref 11–51)

## 2018-12-17 MED ORDER — LACTATED RINGERS IV BOLUS
1000.0000 mL | Freq: Once | INTRAVENOUS | Status: AC
  Administered 2018-12-17: 21:00:00 1000 mL via INTRAVENOUS

## 2018-12-17 MED ORDER — IOHEXOL 300 MG/ML  SOLN
100.0000 mL | Freq: Once | INTRAMUSCULAR | Status: AC | PRN
  Administered 2018-12-17: 19:00:00 100 mL via INTRAVENOUS

## 2018-12-17 NOTE — ED Triage Notes (Signed)
Pt came to ED from outpatient CT. Per CT staff, pt had abnormal results. Pt c/o abdominal pain. Please see results.

## 2018-12-17 NOTE — ED Provider Notes (Signed)
[facility] Emergency Department Provider Note   ____________________________________________   First MD Initiated Contact with Patient 12/17/18 1952     (approximate)  I have reviewed the triage vital signs and the nursing notes.   HISTORY  Chief Complaint Abdominal Pain    HPI Craig Anderson is a 63 y.o. male with no significant past medical history who presents to the ED complaining of abdominal pain.  Patient reports almost 1 month of bilateral lower quadrant abdominal pain which has gradually been worsening.  He describes it as sharp and constant, not exacerbated or alleviated by anything.  He has had a very poor appetite since onset of symptoms, but denies nausea or vomiting.  He states that he has been having difficulty with his bowel movements, will only pass small ribbons of stool at a time.  He also describes increasing difficulty urinating, but denies any dysuria or hematuria.  He was seen by his PCP for this problem earlier today and referred for a CT scan, subsequently referred to the ED when this was found to be abnormal.        Past Medical History:  Diagnosis Date  . Carcinoma (HCC)    basil cell  . Pneumonia     Patient Active Problem List   Diagnosis Date Noted  . B12 deficiency 04/04/2018  . Chronic hoarseness 02/19/2018  . History of substance abuse ([place]) 02/19/2018  . Lumbar disc disease with radiculopathy 02/19/2018  . Tobacco abuse 02/19/2018    Past Surgical History:  Procedure Laterality Date  . BACK SURGERY  2014  . BASAL CELL CARCINOMA EXCISION    . CO2 LASER APPLICATION Bilateral 10/04/9933   Procedure: CO2 LASER APPLICATION for ablation of condyloma of inner thighs;  Surgeon: Abbie Sons, MD;  Location: [facility] ORS;  Service: Urology;  Laterality: Bilateral;  . CONDYLOMA EXCISION/FULGURATION N/A 05/08/2018   Procedure: Excision of penile CONDYLOMA, CO2 laser ablation of condyloma of inner thighs;  Surgeon:  Abbie Sons, MD;  Location: [facility] ORS;  Service: Urology;  Laterality: N/A;  . vocal chord     tumor removal    Prior to Admission medications   Medication Sig Start Date End Date Taking? Authorizing Provider  aspirin EC 81 MG tablet Take 81 mg by mouth daily.     [provider]  CHANTIX STARTING MONTH PAK 0.5 MG X 11 & 1 MG X 42 tablet See admin instructions. see package 02/20/18   [provider]  DULoxetine (CYMBALTA) 30 MG capsule Take 30 mg by mouth daily.  02/19/18 02/19/19  [provider]  Multiple Vitamin (MULTI-VITAMINS) TABS Take 1 tablet by mouth daily.     [provider]  Potassium 99 MG TABS Take 99 mg by mouth daily.     [provider]  traMADol (ULTRAM) 50 MG tablet Take 50 mg by mouth every 6 (six) hours as needed. 05/06/18   [provider]  vitamin B-12 (CYANOCOBALAMIN) 1000 MCG tablet Take 1,000 mcg by mouth daily.     [provider]    Allergies Patient has no known allergies.  Family History  Problem Relation Age of Onset  . Kidney cancer Neg Hx   . Kidney failure Neg Hx   . Prostate cancer Neg Hx   . Sickle cell anemia Neg Hx   . Tuberculosis Neg Hx     Social History Social History   Tobacco Use  . Smoking status: Current Every Day Smoker  Packs/day: 0.50    Types: Cigarettes  . Smokeless tobacco: Never Used  Substance Use Topics  . Alcohol use: Not Currently  . Drug use: Not Currently    Comment: quit in 2002    Review of Systems  Constitutional: No fever/chills Eyes: No visual changes. ENT: No sore throat. Cardiovascular: Denies chest pain. Respiratory: Denies shortness of breath. Gastrointestinal: Positive for abdominal pain.  No nausea, no vomiting.  No diarrhea.  No constipation. Genitourinary: Negative for dysuria. Musculoskeletal: Negative for back pain. Skin: Negative for rash. Neurological: Negative for headaches, focal weakness or numbness.   ____________________________________________   PHYSICAL EXAM:  VITAL SIGNS: ED Triage Vitals  Enc Vitals Group     BP      Pulse      Resp      Temp      Temp src      SpO2      Weight      Height      Head Circumference      Peak Flow      Pain Score      Pain Loc      Pain Edu?      Excl. in [place]?     Constitutional: Alert and oriented. Eyes: Conjunctivae are normal. Head: Atraumatic. Nose: No congestion/rhinnorhea. Mouth/Throat: Mucous membranes are moist. Neck: Normal ROM Cardiovascular: Normal rate, regular rhythm. Grossly normal heart sounds. Respiratory: Normal respiratory effort.  No retractions. Lungs CTAB. Gastrointestinal: Soft with bilateral lower quadrant tenderness. No distention. Genitourinary: deferred Musculoskeletal: No lower extremity tenderness nor edema. Neurologic:  Normal speech and language. No gross focal neurologic deficits are appreciated. Skin:  Skin is warm, dry and intact. No rash noted. Psychiatric: Mood and affect are normal. Speech and behavior are normal.  ____________________________________________   LABS (all labs ordered are listed, but only abnormal results are displayed)  Labs Reviewed  CBC WITH DIFFERENTIAL/PLATELET - Abnormal; Notable for the following components:      Result Value   Monocytes Absolute 1.2 (*)    All other components within normal limits  BASIC METABOLIC PANEL - Abnormal; Notable for the following components:   Glucose, Bld 118 (*)    Calcium 8.5 (*)    All other components within normal limits  SARS CORONAVIRUS 2  HEPATIC FUNCTION PANEL  LIPASE, BLOOD     PROCEDURES  Procedure(s) performed (including Critical Care):  Procedures   ____________________________________________   INITIAL IMPRESSION / ASSESSMENT AND PLAN / ED COURSE       63 year old male presenting with approximately 1 month of bilateral lower quadrant abdominal pain with decreased appetite and changes in bowel movements.   Reviewed CT results obtained prior to ED presentation, which are concerning for malignancy associated with the patient's small bowel.  Do not suspect infectious etiology at this time as he has no fevers or other infectious symptoms.  Patient also does not appear to have an acute bowel obstruction, had a bowel movement just prior to arrival in the ED and has had no nausea or vomiting.  Labs are unremarkable.  Advised patient on being admitted to the hospital to initiate work-up for malignancy, however he wishes to be discharged home at this time to follow-up with his PCP in the morning.  Counseled patient to return to the ED for any symptoms concerning for bowel obstruction or other worsening symptoms, patient agrees with plan.      ____________________________________________   FINAL CLINICAL IMPRESSION(S) / ED DIAGNOSES  Final diagnoses:  Small  bowel lesion  Lower abdominal pain     ED Discharge Orders    None       Note:  This document was prepared using Dragon voice recognition software and may include unintentional dictation errors.   Blake Divine, MD 12/17/18 2221

## 2018-12-17 NOTE — ED Notes (Signed)
Pt sent here due to abnormal CT result. Pt respirations even and unlabored. Pt able to ambulate to stretcher. Pt in NAD at this time.

## 2018-12-18 DIAGNOSIS — C801 Malignant (primary) neoplasm, unspecified: Secondary | ICD-10-CM | POA: Diagnosis not present

## 2018-12-18 DIAGNOSIS — C786 Secondary malignant neoplasm of retroperitoneum and peritoneum: Secondary | ICD-10-CM | POA: Diagnosis not present

## 2018-12-18 DIAGNOSIS — C178 Malignant neoplasm of overlapping sites of small intestine: Secondary | ICD-10-CM | POA: Diagnosis not present

## 2018-12-18 LAB — SARS CORONAVIRUS 2 (TAT 6-24 HRS): SARS Coronavirus 2: NEGATIVE

## 2018-12-19 DIAGNOSIS — F17210 Nicotine dependence, cigarettes, uncomplicated: Secondary | ICD-10-CM | POA: Diagnosis not present

## 2018-12-19 DIAGNOSIS — K828 Other specified diseases of gallbladder: Secondary | ICD-10-CM | POA: Diagnosis not present

## 2018-12-19 DIAGNOSIS — R188 Other ascites: Secondary | ICD-10-CM | POA: Diagnosis not present

## 2018-12-19 DIAGNOSIS — R933 Abnormal findings on diagnostic imaging of other parts of digestive tract: Secondary | ICD-10-CM | POA: Diagnosis not present

## 2018-12-19 DIAGNOSIS — R918 Other nonspecific abnormal finding of lung field: Secondary | ICD-10-CM | POA: Diagnosis not present

## 2018-12-27 DIAGNOSIS — R933 Abnormal findings on diagnostic imaging of other parts of digestive tract: Secondary | ICD-10-CM | POA: Diagnosis not present

## 2018-12-27 DIAGNOSIS — C189 Malignant neoplasm of colon, unspecified: Secondary | ICD-10-CM | POA: Diagnosis not present

## 2018-12-27 DIAGNOSIS — C786 Secondary malignant neoplasm of retroperitoneum and peritoneum: Secondary | ICD-10-CM | POA: Diagnosis not present

## 2018-12-28 DIAGNOSIS — E538 Deficiency of other specified B group vitamins: Secondary | ICD-10-CM | POA: Diagnosis not present

## 2018-12-28 DIAGNOSIS — R739 Hyperglycemia, unspecified: Secondary | ICD-10-CM | POA: Diagnosis not present

## 2018-12-31 DIAGNOSIS — Z01812 Encounter for preprocedural laboratory examination: Secondary | ICD-10-CM | POA: Diagnosis not present

## 2018-12-31 DIAGNOSIS — Z20828 Contact with and (suspected) exposure to other viral communicable diseases: Secondary | ICD-10-CM | POA: Diagnosis not present

## 2019-01-03 DIAGNOSIS — Z7984 Long term (current) use of oral hypoglycemic drugs: Secondary | ICD-10-CM | POA: Diagnosis not present

## 2019-01-03 DIAGNOSIS — C189 Malignant neoplasm of colon, unspecified: Secondary | ICD-10-CM | POA: Diagnosis not present

## 2019-01-03 DIAGNOSIS — K5669 Other partial intestinal obstruction: Secondary | ICD-10-CM | POA: Diagnosis not present

## 2019-01-03 DIAGNOSIS — C19 Malignant neoplasm of rectosigmoid junction: Secondary | ICD-10-CM | POA: Diagnosis not present

## 2019-01-03 DIAGNOSIS — Z85828 Personal history of other malignant neoplasm of skin: Secondary | ICD-10-CM | POA: Diagnosis not present

## 2019-01-03 DIAGNOSIS — Z7982 Long term (current) use of aspirin: Secondary | ICD-10-CM | POA: Diagnosis not present

## 2019-01-03 DIAGNOSIS — Z79899 Other long term (current) drug therapy: Secondary | ICD-10-CM | POA: Diagnosis not present

## 2019-01-03 DIAGNOSIS — E119 Type 2 diabetes mellitus without complications: Secondary | ICD-10-CM | POA: Diagnosis not present

## 2019-01-03 DIAGNOSIS — Z9889 Other specified postprocedural states: Secondary | ICD-10-CM | POA: Diagnosis not present

## 2019-01-03 DIAGNOSIS — K635 Polyp of colon: Secondary | ICD-10-CM | POA: Diagnosis not present

## 2019-01-03 DIAGNOSIS — Z791 Long term (current) use of non-steroidal anti-inflammatories (NSAID): Secondary | ICD-10-CM | POA: Diagnosis not present

## 2019-01-04 DIAGNOSIS — C801 Malignant (primary) neoplasm, unspecified: Secondary | ICD-10-CM | POA: Diagnosis not present

## 2019-01-04 DIAGNOSIS — C786 Secondary malignant neoplasm of retroperitoneum and peritoneum: Secondary | ICD-10-CM | POA: Diagnosis not present

## 2019-01-04 DIAGNOSIS — C189 Malignant neoplasm of colon, unspecified: Secondary | ICD-10-CM | POA: Diagnosis not present

## 2019-01-11 DIAGNOSIS — C801 Malignant (primary) neoplasm, unspecified: Secondary | ICD-10-CM | POA: Diagnosis not present

## 2019-01-11 DIAGNOSIS — Z452 Encounter for adjustment and management of vascular access device: Secondary | ICD-10-CM | POA: Diagnosis not present

## 2019-01-11 DIAGNOSIS — C786 Secondary malignant neoplasm of retroperitoneum and peritoneum: Secondary | ICD-10-CM | POA: Diagnosis not present

## 2019-01-15 DIAGNOSIS — C189 Malignant neoplasm of colon, unspecified: Secondary | ICD-10-CM | POA: Diagnosis not present

## 2019-01-15 DIAGNOSIS — Z0000 Encounter for general adult medical examination without abnormal findings: Secondary | ICD-10-CM | POA: Diagnosis not present

## 2019-01-15 DIAGNOSIS — D510 Vitamin B12 deficiency anemia due to intrinsic factor deficiency: Secondary | ICD-10-CM | POA: Diagnosis not present

## 2019-01-15 DIAGNOSIS — J439 Emphysema, unspecified: Secondary | ICD-10-CM | POA: Diagnosis not present

## 2019-01-18 DIAGNOSIS — R109 Unspecified abdominal pain: Secondary | ICD-10-CM | POA: Diagnosis not present

## 2019-01-18 DIAGNOSIS — Z5111 Encounter for antineoplastic chemotherapy: Secondary | ICD-10-CM | POA: Diagnosis not present

## 2019-01-18 DIAGNOSIS — C189 Malignant neoplasm of colon, unspecified: Secondary | ICD-10-CM | POA: Diagnosis not present

## 2019-01-18 DIAGNOSIS — E119 Type 2 diabetes mellitus without complications: Secondary | ICD-10-CM | POA: Diagnosis not present

## 2019-01-18 DIAGNOSIS — F17210 Nicotine dependence, cigarettes, uncomplicated: Secondary | ICD-10-CM | POA: Diagnosis not present

## 2019-01-18 DIAGNOSIS — C786 Secondary malignant neoplasm of retroperitoneum and peritoneum: Secondary | ICD-10-CM | POA: Diagnosis not present

## 2019-01-18 DIAGNOSIS — K5900 Constipation, unspecified: Secondary | ICD-10-CM | POA: Diagnosis not present

## 2019-01-18 DIAGNOSIS — C19 Malignant neoplasm of rectosigmoid junction: Secondary | ICD-10-CM | POA: Diagnosis not present

## 2019-01-20 DIAGNOSIS — C189 Malignant neoplasm of colon, unspecified: Secondary | ICD-10-CM | POA: Diagnosis not present

## 2019-01-24 DIAGNOSIS — C189 Malignant neoplasm of colon, unspecified: Secondary | ICD-10-CM | POA: Diagnosis not present

## 2019-01-30 DIAGNOSIS — Z5111 Encounter for antineoplastic chemotherapy: Secondary | ICD-10-CM | POA: Diagnosis not present

## 2019-01-30 DIAGNOSIS — Z23 Encounter for immunization: Secondary | ICD-10-CM | POA: Diagnosis not present

## 2019-01-30 DIAGNOSIS — R6881 Early satiety: Secondary | ICD-10-CM | POA: Diagnosis not present

## 2019-01-30 DIAGNOSIS — C189 Malignant neoplasm of colon, unspecified: Secondary | ICD-10-CM | POA: Diagnosis not present

## 2019-01-30 DIAGNOSIS — C19 Malignant neoplasm of rectosigmoid junction: Secondary | ICD-10-CM | POA: Diagnosis not present

## 2019-01-30 DIAGNOSIS — R109 Unspecified abdominal pain: Secondary | ICD-10-CM | POA: Diagnosis not present

## 2019-01-30 DIAGNOSIS — C786 Secondary malignant neoplasm of retroperitoneum and peritoneum: Secondary | ICD-10-CM | POA: Diagnosis not present

## 2019-01-30 DIAGNOSIS — R1030 Lower abdominal pain, unspecified: Secondary | ICD-10-CM | POA: Diagnosis not present

## 2019-01-30 DIAGNOSIS — F17210 Nicotine dependence, cigarettes, uncomplicated: Secondary | ICD-10-CM | POA: Diagnosis not present

## 2019-01-30 DIAGNOSIS — E1142 Type 2 diabetes mellitus with diabetic polyneuropathy: Secondary | ICD-10-CM | POA: Diagnosis not present

## 2019-01-30 DIAGNOSIS — R5383 Other fatigue: Secondary | ICD-10-CM | POA: Diagnosis not present

## 2019-01-30 DIAGNOSIS — R197 Diarrhea, unspecified: Secondary | ICD-10-CM | POA: Diagnosis not present

## 2019-02-01 DIAGNOSIS — C189 Malignant neoplasm of colon, unspecified: Secondary | ICD-10-CM | POA: Diagnosis not present

## 2019-02-12 DIAGNOSIS — E119 Type 2 diabetes mellitus without complications: Secondary | ICD-10-CM | POA: Diagnosis not present

## 2019-02-12 DIAGNOSIS — F1911 Other psychoactive substance abuse, in remission: Secondary | ICD-10-CM | POA: Diagnosis not present

## 2019-02-12 DIAGNOSIS — C189 Malignant neoplasm of colon, unspecified: Secondary | ICD-10-CM | POA: Diagnosis not present

## 2019-02-13 DIAGNOSIS — Z5112 Encounter for antineoplastic immunotherapy: Secondary | ICD-10-CM | POA: Diagnosis not present

## 2019-02-13 DIAGNOSIS — R1030 Lower abdominal pain, unspecified: Secondary | ICD-10-CM | POA: Diagnosis not present

## 2019-02-13 DIAGNOSIS — C189 Malignant neoplasm of colon, unspecified: Secondary | ICD-10-CM | POA: Diagnosis not present

## 2019-02-13 DIAGNOSIS — R5383 Other fatigue: Secondary | ICD-10-CM | POA: Diagnosis not present

## 2019-02-13 DIAGNOSIS — Z5111 Encounter for antineoplastic chemotherapy: Secondary | ICD-10-CM | POA: Diagnosis not present

## 2019-02-13 DIAGNOSIS — C19 Malignant neoplasm of rectosigmoid junction: Secondary | ICD-10-CM | POA: Diagnosis not present

## 2019-02-13 DIAGNOSIS — E119 Type 2 diabetes mellitus without complications: Secondary | ICD-10-CM | POA: Diagnosis not present

## 2019-02-13 DIAGNOSIS — C786 Secondary malignant neoplasm of retroperitoneum and peritoneum: Secondary | ICD-10-CM | POA: Diagnosis not present

## 2019-02-13 DIAGNOSIS — F17210 Nicotine dependence, cigarettes, uncomplicated: Secondary | ICD-10-CM | POA: Diagnosis not present

## 2019-02-15 DIAGNOSIS — C189 Malignant neoplasm of colon, unspecified: Secondary | ICD-10-CM | POA: Diagnosis not present

## 2019-02-27 DIAGNOSIS — Z5111 Encounter for antineoplastic chemotherapy: Secondary | ICD-10-CM | POA: Diagnosis not present

## 2019-02-27 DIAGNOSIS — Z5112 Encounter for antineoplastic immunotherapy: Secondary | ICD-10-CM | POA: Diagnosis not present

## 2019-02-27 DIAGNOSIS — C786 Secondary malignant neoplasm of retroperitoneum and peritoneum: Secondary | ICD-10-CM | POA: Diagnosis not present

## 2019-02-27 DIAGNOSIS — F17210 Nicotine dependence, cigarettes, uncomplicated: Secondary | ICD-10-CM | POA: Diagnosis not present

## 2019-02-27 DIAGNOSIS — R5383 Other fatigue: Secondary | ICD-10-CM | POA: Diagnosis not present

## 2019-02-27 DIAGNOSIS — C189 Malignant neoplasm of colon, unspecified: Secondary | ICD-10-CM | POA: Diagnosis not present

## 2019-02-27 DIAGNOSIS — E119 Type 2 diabetes mellitus without complications: Secondary | ICD-10-CM | POA: Diagnosis not present

## 2019-02-27 DIAGNOSIS — R1030 Lower abdominal pain, unspecified: Secondary | ICD-10-CM | POA: Diagnosis not present

## 2019-03-01 DIAGNOSIS — C189 Malignant neoplasm of colon, unspecified: Secondary | ICD-10-CM | POA: Diagnosis not present

## 2019-03-13 DIAGNOSIS — C799 Secondary malignant neoplasm of unspecified site: Secondary | ICD-10-CM | POA: Diagnosis not present

## 2019-03-13 DIAGNOSIS — C189 Malignant neoplasm of colon, unspecified: Secondary | ICD-10-CM | POA: Diagnosis not present

## 2019-03-22 DIAGNOSIS — Z5111 Encounter for antineoplastic chemotherapy: Secondary | ICD-10-CM | POA: Diagnosis not present

## 2019-03-22 DIAGNOSIS — C189 Malignant neoplasm of colon, unspecified: Secondary | ICD-10-CM | POA: Diagnosis not present

## 2019-03-22 DIAGNOSIS — Z5112 Encounter for antineoplastic immunotherapy: Secondary | ICD-10-CM | POA: Diagnosis not present

## 2019-03-24 DIAGNOSIS — C189 Malignant neoplasm of colon, unspecified: Secondary | ICD-10-CM | POA: Diagnosis not present

## 2019-04-03 DIAGNOSIS — R1030 Lower abdominal pain, unspecified: Secondary | ICD-10-CM | POA: Diagnosis not present

## 2019-04-03 DIAGNOSIS — Z5112 Encounter for antineoplastic immunotherapy: Secondary | ICD-10-CM | POA: Diagnosis not present

## 2019-04-03 DIAGNOSIS — C786 Secondary malignant neoplasm of retroperitoneum and peritoneum: Secondary | ICD-10-CM | POA: Diagnosis not present

## 2019-04-03 DIAGNOSIS — F17210 Nicotine dependence, cigarettes, uncomplicated: Secondary | ICD-10-CM | POA: Diagnosis not present

## 2019-04-03 DIAGNOSIS — E119 Type 2 diabetes mellitus without complications: Secondary | ICD-10-CM | POA: Diagnosis not present

## 2019-04-03 DIAGNOSIS — Z5111 Encounter for antineoplastic chemotherapy: Secondary | ICD-10-CM | POA: Diagnosis not present

## 2019-04-03 DIAGNOSIS — Z7984 Long term (current) use of oral hypoglycemic drugs: Secondary | ICD-10-CM | POA: Diagnosis not present

## 2019-04-03 DIAGNOSIS — F439 Reaction to severe stress, unspecified: Secondary | ICD-10-CM | POA: Diagnosis not present

## 2019-04-03 DIAGNOSIS — C189 Malignant neoplasm of colon, unspecified: Secondary | ICD-10-CM | POA: Diagnosis not present

## 2019-04-05 DIAGNOSIS — C189 Malignant neoplasm of colon, unspecified: Secondary | ICD-10-CM | POA: Diagnosis not present

## 2019-04-17 DIAGNOSIS — R1030 Lower abdominal pain, unspecified: Secondary | ICD-10-CM | POA: Diagnosis not present

## 2019-04-17 DIAGNOSIS — C189 Malignant neoplasm of colon, unspecified: Secondary | ICD-10-CM | POA: Diagnosis not present

## 2019-04-17 DIAGNOSIS — R5383 Other fatigue: Secondary | ICD-10-CM | POA: Diagnosis not present

## 2019-04-17 DIAGNOSIS — C19 Malignant neoplasm of rectosigmoid junction: Secondary | ICD-10-CM | POA: Diagnosis not present

## 2019-04-17 DIAGNOSIS — M549 Dorsalgia, unspecified: Secondary | ICD-10-CM | POA: Diagnosis not present

## 2019-04-17 DIAGNOSIS — F17210 Nicotine dependence, cigarettes, uncomplicated: Secondary | ICD-10-CM | POA: Diagnosis not present

## 2019-04-17 DIAGNOSIS — E119 Type 2 diabetes mellitus without complications: Secondary | ICD-10-CM | POA: Diagnosis not present

## 2019-04-17 DIAGNOSIS — C786 Secondary malignant neoplasm of retroperitoneum and peritoneum: Secondary | ICD-10-CM | POA: Diagnosis not present

## 2019-04-24 DIAGNOSIS — D510 Vitamin B12 deficiency anemia due to intrinsic factor deficiency: Secondary | ICD-10-CM | POA: Diagnosis not present

## 2019-04-24 DIAGNOSIS — Z125 Encounter for screening for malignant neoplasm of prostate: Secondary | ICD-10-CM | POA: Diagnosis not present

## 2019-04-24 DIAGNOSIS — M5116 Intervertebral disc disorders with radiculopathy, lumbar region: Secondary | ICD-10-CM | POA: Diagnosis not present

## 2019-05-01 ENCOUNTER — Ambulatory Visit
Admission: RE | Admit: 2019-05-01 | Discharge: 2019-05-01 | Disposition: A | Discharge status: Home / Self Care | Payer: PPO | Source: Ambulatory Visit | Attending: Internal Medicine | Admitting: Internal Medicine

## 2019-05-01 ENCOUNTER — Other Ambulatory Visit: Payer: Self-pay

## 2019-05-01 ENCOUNTER — Other Ambulatory Visit: Payer: Self-pay | Admitting: Internal Medicine

## 2019-05-01 DIAGNOSIS — E1165 Type 2 diabetes mellitus with hyperglycemia: Secondary | ICD-10-CM | POA: Diagnosis not present

## 2019-05-01 DIAGNOSIS — D510 Vitamin B12 deficiency anemia due to intrinsic factor deficiency: Secondary | ICD-10-CM | POA: Diagnosis not present

## 2019-05-01 DIAGNOSIS — S22000A Wedge compression fracture of unspecified thoracic vertebra, initial encounter for closed fracture: Secondary | ICD-10-CM

## 2019-05-01 DIAGNOSIS — M5116 Intervertebral disc disorders with radiculopathy, lumbar region: Secondary | ICD-10-CM | POA: Diagnosis not present

## 2019-05-01 DIAGNOSIS — Z0000 Encounter for general adult medical examination without abnormal findings: Secondary | ICD-10-CM | POA: Diagnosis not present

## 2019-05-01 DIAGNOSIS — M5414 Radiculopathy, thoracic region: Secondary | ICD-10-CM | POA: Diagnosis not present

## 2019-05-01 DIAGNOSIS — S22060A Wedge compression fracture of T7-T8 vertebra, initial encounter for closed fracture: Secondary | ICD-10-CM | POA: Diagnosis not present

## 2019-05-08 DIAGNOSIS — C189 Malignant neoplasm of colon, unspecified: Secondary | ICD-10-CM | POA: Diagnosis not present

## 2019-05-08 DIAGNOSIS — M549 Dorsalgia, unspecified: Secondary | ICD-10-CM | POA: Diagnosis not present

## 2019-05-08 DIAGNOSIS — C786 Secondary malignant neoplasm of retroperitoneum and peritoneum: Secondary | ICD-10-CM | POA: Diagnosis not present

## 2019-05-08 DIAGNOSIS — J439 Emphysema, unspecified: Secondary | ICD-10-CM | POA: Diagnosis not present

## 2019-05-08 DIAGNOSIS — Z5111 Encounter for antineoplastic chemotherapy: Secondary | ICD-10-CM | POA: Diagnosis not present

## 2019-05-08 DIAGNOSIS — E119 Type 2 diabetes mellitus without complications: Secondary | ICD-10-CM | POA: Diagnosis not present

## 2019-05-08 DIAGNOSIS — Z5112 Encounter for antineoplastic immunotherapy: Secondary | ICD-10-CM | POA: Diagnosis not present

## 2019-05-08 DIAGNOSIS — F17210 Nicotine dependence, cigarettes, uncomplicated: Secondary | ICD-10-CM | POA: Diagnosis not present

## 2019-05-08 DIAGNOSIS — C19 Malignant neoplasm of rectosigmoid junction: Secondary | ICD-10-CM | POA: Diagnosis not present

## 2019-05-08 DIAGNOSIS — S22000A Wedge compression fracture of unspecified thoracic vertebra, initial encounter for closed fracture: Secondary | ICD-10-CM | POA: Diagnosis not present

## 2019-05-08 DIAGNOSIS — S22069D Unspecified fracture of T7-T8 vertebra, subsequent encounter for fracture with routine healing: Secondary | ICD-10-CM | POA: Diagnosis not present

## 2019-05-10 DIAGNOSIS — S22000A Wedge compression fracture of unspecified thoracic vertebra, initial encounter for closed fracture: Secondary | ICD-10-CM | POA: Diagnosis not present

## 2019-05-10 DIAGNOSIS — C189 Malignant neoplasm of colon, unspecified: Secondary | ICD-10-CM | POA: Diagnosis not present

## 2019-05-13 ENCOUNTER — Other Ambulatory Visit: Payer: Self-pay | Admitting: Orthopedic Surgery

## 2019-05-14 ENCOUNTER — Other Ambulatory Visit: Payer: Self-pay

## 2019-05-14 ENCOUNTER — Encounter
Admission: RE | Admit: 2019-05-14 | Discharge: 2019-05-14 | Disposition: A | Discharge status: Home / Self Care | Payer: PPO | Source: Ambulatory Visit | Attending: Orthopedic Surgery | Admitting: Orthopedic Surgery

## 2019-05-14 ENCOUNTER — Other Ambulatory Visit: Payer: PPO

## 2019-05-14 HISTORY — DX: Emphysema, unspecified: J43.9

## 2019-05-14 HISTORY — DX: Type 2 diabetes mellitus without complications: E11.9

## 2019-05-14 HISTORY — DX: Vitamin B12 deficiency anemia due to intrinsic factor deficiency: D51.0

## 2019-05-14 HISTORY — DX: Malignant neoplasm of colon, unspecified: C18.9

## 2019-05-14 NOTE — Patient Instructions (Signed)
Your procedure is scheduled on: Thursday, January 14 Report to Day Surgery on the 2nd floor of the [facility]. To find out your arrival time, please call [phone] between 1PM - 3PM on: Wednesday, January 13  REMEMBER: Instructions that are not followed completely may result in serious medical risk, up to and including death; or upon the discretion of your surgeon and anesthesiologist your surgery may need to be rescheduled.  Do not eat food after midnight the night before surgery.  No gum chewing, lozengers or hard candies.  You may however, drink water up to 2 hours before you are scheduled to arrive for your surgery. Do not drink anything within 2 hours of the start of your surgery.  ENSURE PRE-SURGERY CARBOHYDRATE DRINK:  Complete drinking 3 hours prior to surgery.  No Alcohol for 24 hours before or after surgery.  No Smoking including e-cigarettes for 24 hours prior to surgery.  No chewable tobacco products for at least 6 hours prior to surgery.  No nicotine patches on the day of surgery.  On the morning of surgery brush your teeth with toothpaste and water, you may rinse your mouth with mouthwash if you wish. Do not swallow any toothpaste or mouthwash.  Notify your doctor if there is any change in your medical condition (cold, fever, infection).  Do not wear jewelry, make-up, hairpins, clips or nail polish.  Do not wear lotions, powders, or perfumes.   Do not shave 48 hours prior to surgery.   Contacts and dentures may not be worn into surgery.  Do not bring valuables to the hospital, including drivers license, insurance or credit cards.  [place] [facility] is not responsible for any belongings or valuables.   TAKE THESE MEDICATIONS THE MORNING OF SURGERY:  1.  tamsulosin (Flomax)  Use CHG Soap as directed on instruction sheet.  Stop Anti-inflammatories (NSAIDS) such as Advil, Aleve, Ibuprofen, Motrin, Naproxen, Naprosyn and Aspirin based products such as Excedrin,  Goodys Powder, BC Powder. (May take Tylenol or Acetaminophen if needed.)  Stop ANY OVER THE COUNTER supplements until after surgery. (turmeric, vitamin E) (May continue Vitamin D, Vitamin B, and multivitamin.)  Wear comfortable clothing (specific to your surgery type) to the hospital.  If you are being discharged the day of surgery, you will not be allowed to drive home. You will need a responsible adult to drive you home and stay with you that night.   If you are taking public transportation, you will need to have a responsible adult with you. Please confirm with your physician that it is acceptable to use public transportation.   Please call [phone] if you have any questions about these instructions.

## 2019-05-15 ENCOUNTER — Encounter
Admission: RE | Admit: 2019-05-15 | Discharge: 2019-05-15 | Disposition: A | Discharge status: Home / Self Care | Payer: PPO | Source: Ambulatory Visit | Attending: Orthopedic Surgery | Admitting: Orthopedic Surgery

## 2019-05-15 ENCOUNTER — Other Ambulatory Visit: Admission: RE | Admit: 2019-05-15 | Payer: PPO | Source: Ambulatory Visit

## 2019-05-15 DIAGNOSIS — I491 Atrial premature depolarization: Secondary | ICD-10-CM | POA: Diagnosis not present

## 2019-05-15 DIAGNOSIS — Z20822 Contact with and (suspected) exposure to covid-19: Secondary | ICD-10-CM | POA: Insufficient documentation

## 2019-05-15 DIAGNOSIS — Z01810 Encounter for preprocedural cardiovascular examination: Secondary | ICD-10-CM | POA: Diagnosis not present

## 2019-05-15 DIAGNOSIS — Z01812 Encounter for preprocedural laboratory examination: Secondary | ICD-10-CM | POA: Diagnosis not present

## 2019-05-15 DIAGNOSIS — E119 Type 2 diabetes mellitus without complications: Secondary | ICD-10-CM | POA: Diagnosis not present

## 2019-05-15 DIAGNOSIS — Z01818 Encounter for other preprocedural examination: Secondary | ICD-10-CM | POA: Diagnosis not present

## 2019-05-15 LAB — SARS CORONAVIRUS 2 (TAT 6-24 HRS): SARS Coronavirus 2: NEGATIVE

## 2019-05-15 NOTE — Anesthesia Preprocedure Evaluation (Addendum)
Anesthesia Evaluation  Patient identified by MRN, date of birth, ID band Patient awake    Reviewed: Allergy & Precautions, NPO status , Patient's Chart, lab work & pertinent test results  History of Anesthesia Complications Negative for: history of anesthetic complications  Airway Mallampati: III       Dental  (+) Edentulous Lower, Edentulous Upper   Pulmonary neg sleep apnea, COPD, Current Smoker,           Cardiovascular (-) hypertension(-) Past MI and (-) CHF (-) dysrhythmias (-) Valvular Problems/Murmurs     Neuro/Psych neg Seizures    GI/Hepatic Neg liver ROS, neg GERD  ,  Endo/Other  diabetes, Type 2, Oral Hypoglycemic Agents  Renal/GU negative Renal ROS     Musculoskeletal   Abdominal   Peds  Hematology  (+) anemia ,   Anesthesia Other Findings    Reproductive/Obstetrics                            Anesthesia Physical Anesthesia Plan  ASA: III  Anesthesia Plan: General   Post-op Pain Management:    Induction: Intravenous  PONV Risk Score and Plan: TIVA  Airway Management Planned: Nasal Cannula and Simple Face Mask  Additional Equipment:   Intra-op Plan:   Post-operative Plan:   Informed Consent: I have reviewed the patients History and Physical, chart, labs and discussed the procedure including the risks, benefits and alternatives for the proposed anesthesia with the patient or authorized representative who has indicated his/her understanding and acceptance.       Plan Discussed with:   Anesthesia Plan Comments:         Anesthesia Quick Evaluation

## 2019-05-15 NOTE — Pre-Procedure Instructions (Signed)
Pt arrived for pre-op EKG, clearance requested per Dr. Toy Care (anesthesiology) after review of EKG.  No prior EKG's for comparison.  Clearance request and EKG tracing faxed to Dr. Theodore Demark office.  Receipt confirmed.

## 2019-05-16 ENCOUNTER — Ambulatory Visit: Payer: PPO

## 2019-05-16 ENCOUNTER — Ambulatory Visit
Admission: RE | Admit: 2019-05-16 | Discharge: 2019-05-16 | Disposition: A | Discharge status: Home / Self Care | Payer: PPO | Attending: Orthopedic Surgery | Admitting: Orthopedic Surgery

## 2019-05-16 ENCOUNTER — Other Ambulatory Visit: Payer: Self-pay

## 2019-05-16 ENCOUNTER — Encounter: Payer: Self-pay | Admitting: Orthopedic Surgery

## 2019-05-16 ENCOUNTER — Ambulatory Visit: Payer: PPO | Admitting: Anesthesiology

## 2019-05-16 ENCOUNTER — Encounter
Admission: RE | Disposition: A | Discharge status: Home / Self Care | Payer: Self-pay | Source: Home / Self Care | Attending: Orthopedic Surgery

## 2019-05-16 DIAGNOSIS — F17210 Nicotine dependence, cigarettes, uncomplicated: Secondary | ICD-10-CM | POA: Diagnosis not present

## 2019-05-16 DIAGNOSIS — E119 Type 2 diabetes mellitus without complications: Secondary | ICD-10-CM | POA: Insufficient documentation

## 2019-05-16 DIAGNOSIS — Z85828 Personal history of other malignant neoplasm of skin: Secondary | ICD-10-CM | POA: Diagnosis not present

## 2019-05-16 DIAGNOSIS — S22060A Wedge compression fracture of T7-T8 vertebra, initial encounter for closed fracture: Secondary | ICD-10-CM | POA: Diagnosis not present

## 2019-05-16 DIAGNOSIS — Z85038 Personal history of other malignant neoplasm of large intestine: Secondary | ICD-10-CM | POA: Diagnosis not present

## 2019-05-16 DIAGNOSIS — S22050A Wedge compression fracture of T5-T6 vertebra, initial encounter for closed fracture: Secondary | ICD-10-CM | POA: Diagnosis not present

## 2019-05-16 DIAGNOSIS — M5116 Intervertebral disc disorders with radiculopathy, lumbar region: Secondary | ICD-10-CM | POA: Diagnosis not present

## 2019-05-16 DIAGNOSIS — Z79899 Other long term (current) drug therapy: Secondary | ICD-10-CM | POA: Insufficient documentation

## 2019-05-16 DIAGNOSIS — M4854XA Collapsed vertebra, not elsewhere classified, thoracic region, initial encounter for fracture: Secondary | ICD-10-CM | POA: Insufficient documentation

## 2019-05-16 DIAGNOSIS — Z7952 Long term (current) use of systemic steroids: Secondary | ICD-10-CM | POA: Diagnosis not present

## 2019-05-16 DIAGNOSIS — J439 Emphysema, unspecified: Secondary | ICD-10-CM | POA: Diagnosis not present

## 2019-05-16 DIAGNOSIS — Z7984 Long term (current) use of oral hypoglycemic drugs: Secondary | ICD-10-CM | POA: Diagnosis not present

## 2019-05-16 DIAGNOSIS — E538 Deficiency of other specified B group vitamins: Secondary | ICD-10-CM | POA: Diagnosis not present

## 2019-05-16 DIAGNOSIS — Z981 Arthrodesis status: Secondary | ICD-10-CM | POA: Diagnosis not present

## 2019-05-16 DIAGNOSIS — S22000A Wedge compression fracture of unspecified thoracic vertebra, initial encounter for closed fracture: Secondary | ICD-10-CM | POA: Diagnosis not present

## 2019-05-16 DIAGNOSIS — R490 Dysphonia: Secondary | ICD-10-CM | POA: Diagnosis not present

## 2019-05-16 DIAGNOSIS — Z419 Encounter for procedure for purposes other than remedying health state, unspecified: Secondary | ICD-10-CM

## 2019-05-16 DIAGNOSIS — D649 Anemia, unspecified: Secondary | ICD-10-CM | POA: Insufficient documentation

## 2019-05-16 HISTORY — PX: KYPHOPLASTY: SHX5884

## 2019-05-16 LAB — GLUCOSE, CAPILLARY
Glucose-Capillary: 203 mg/dL — ABNORMAL HIGH (ref 70–99)
Glucose-Capillary: 181 mg/dL — ABNORMAL HIGH (ref 70–99)

## 2019-05-16 SURGERY — KYPHOPLASTY
Anesthesia: General | Site: Back

## 2019-05-16 MED ORDER — FENTANYL CITRATE (PF) 100 MCG/2ML IJ SOLN: 25.0000 ug | INTRAMUSCULAR | Status: DC | PRN

## 2019-05-16 MED ORDER — ONDANSETRON HCL 4 MG/2ML IJ SOLN
INTRAMUSCULAR | Status: AC
  Filled 2019-05-16: qty 2

## 2019-05-16 MED ORDER — PROPOFOL 500 MG/50ML IV EMUL
INTRAVENOUS | Status: DC | PRN
  Administered 2019-05-16: 50 ug/kg/min via INTRAVENOUS

## 2019-05-16 MED ORDER — KETAMINE HCL 10 MG/ML IJ SOLN
INTRAMUSCULAR | Status: DC | PRN
  Administered 2019-05-16: 10 mg via INTRAVENOUS

## 2019-05-16 MED ORDER — SODIUM CHLORIDE 0.9 % IV SOLN: INTRAVENOUS | Status: DC

## 2019-05-16 MED ORDER — BUPIVACAINE-EPINEPHRINE (PF) 0.5% -1:200000 IJ SOLN
INTRAMUSCULAR | Status: DC | PRN
  Administered 2019-05-16: 20 mL

## 2019-05-16 MED ORDER — PROPOFOL 10 MG/ML IV BOLUS
INTRAVENOUS | Status: AC
  Filled 2019-05-16: qty 20

## 2019-05-16 MED ORDER — LIDOCAINE HCL (PF) 2 % IJ SOLN
INTRAMUSCULAR | Status: AC
  Filled 2019-05-16: qty 10

## 2019-05-16 MED ORDER — FAMOTIDINE 20 MG PO TABS: 20.0000 mg | ORAL_TABLET | Freq: Once | ORAL | Status: DC

## 2019-05-16 MED ORDER — LIDOCAINE HCL (CARDIAC) PF 100 MG/5ML IV SOSY
PREFILLED_SYRINGE | INTRAVENOUS | Status: DC | PRN
  Administered 2019-05-16: 50 mg via INTRAVENOUS

## 2019-05-16 MED ORDER — PROPOFOL 10 MG/ML IV BOLUS
INTRAVENOUS | Status: DC | PRN
  Administered 2019-05-16: 10 ug via INTRAVENOUS
  Administered 2019-05-16: 20 ug via INTRAVENOUS

## 2019-05-16 MED ORDER — TRAMADOL HCL 50 MG PO TABS: 50.0000 mg | ORAL_TABLET | Freq: Four times a day (QID) | ORAL | 0 refills | Status: AC | PRN

## 2019-05-16 MED ORDER — KETAMINE HCL 50 MG/ML IJ SOLN
INTRAMUSCULAR | Status: AC
  Filled 2019-05-16: qty 10

## 2019-05-16 MED ORDER — ONDANSETRON HCL 4 MG/2ML IJ SOLN
INTRAMUSCULAR | Status: DC | PRN
  Administered 2019-05-16: 4 mg via INTRAVENOUS

## 2019-05-16 MED ORDER — CEFAZOLIN SODIUM-DEXTROSE 2-4 GM/100ML-% IV SOLN
2.0000 g | INTRAVENOUS | Status: AC
  Administered 2019-05-16: 2 g via INTRAVENOUS

## 2019-05-16 MED ORDER — FENTANYL CITRATE (PF) 100 MCG/2ML IJ SOLN
INTRAMUSCULAR | Status: AC
  Filled 2019-05-16: qty 2

## 2019-05-16 MED ORDER — CEFAZOLIN SODIUM-DEXTROSE 2-4 GM/100ML-% IV SOLN
INTRAVENOUS | Status: AC
  Filled 2019-05-16: qty 100

## 2019-05-16 MED ORDER — CHLORHEXIDINE GLUCONATE 4 % EX LIQD: 60.0000 mL | Freq: Once | CUTANEOUS | Status: DC

## 2019-05-16 MED ORDER — IOHEXOL 180 MG/ML  SOLN
INTRAMUSCULAR | Status: DC | PRN
  Administered 2019-05-16: 12:00:00 30 mL

## 2019-05-16 MED ORDER — FENTANYL CITRATE (PF) 100 MCG/2ML IJ SOLN
INTRAMUSCULAR | Status: DC | PRN
  Administered 2019-05-16 (×4): 25 ug via INTRAVENOUS

## 2019-05-16 MED ORDER — LIDOCAINE HCL 1 % IJ SOLN
INTRAMUSCULAR | Status: DC | PRN
  Administered 2019-05-16: 30 mL

## 2019-05-16 MED ORDER — ONDANSETRON HCL 4 MG/2ML IJ SOLN: 4.0000 mg | Freq: Once | INTRAMUSCULAR | Status: DC | PRN

## 2019-05-16 SURGICAL SUPPLY — 21 items
CEMENT KYPHON CX01A KIT/MIXER (Cement) ×3 IMPLANT
COVER WAND RF STERILE (DRAPES) ×3 IMPLANT
DERMABOND ADVANCED (GAUZE/BANDAGES/DRESSINGS) ×2
DERMABOND ADVANCED .7 DNX12 (GAUZE/BANDAGES/DRESSINGS) ×1 IMPLANT
DEVICE BIOPSY BONE KYPH (INSTRUMENTS) ×3 IMPLANT
DEVICE BIOPSY BONE KYPHX (INSTRUMENTS) IMPLANT
DRAPE C-ARM XRAY 36X54 (DRAPES) ×3 IMPLANT
DURAPREP 26ML APPLICATOR (WOUND CARE) ×3 IMPLANT
GLOVE SURG SYN 9.0  PF PI (GLOVE) ×2
GLOVE SURG SYN 9.0 PF PI (GLOVE) ×1 IMPLANT
GOWN SRG 2XL LVL 4 RGLN SLV (GOWNS) ×1 IMPLANT
GOWN STRL NON-REIN 2XL LVL4 (GOWNS) ×2
GOWN STRL REUS W/ TWL LRG LVL3 (GOWN DISPOSABLE) ×1 IMPLANT
GOWN STRL REUS W/TWL LRG LVL3 (GOWN DISPOSABLE) ×2
PACK KYPHOPLASTY (MISCELLANEOUS) ×3 IMPLANT
RENTAL RFA  GENERATOR (MISCELLANEOUS)
RENTAL RFA GENERATOR (MISCELLANEOUS) IMPLANT
STRAP SAFETY 5IN WIDE (MISCELLANEOUS) ×3 IMPLANT
TRAY KYPHOPAK 15/2 EXPRESS (KITS) ×3 IMPLANT
TRAY KYPHOPAK 15/3 EXPRESS 1ST (MISCELLANEOUS) IMPLANT
TRAY KYPHOPAK 20/3 EXPRESS 1ST (MISCELLANEOUS) IMPLANT

## 2019-05-16 NOTE — H&P (Signed)
Reviewed paper H+P, will be scanned into chart. No changes noted.  

## 2019-05-16 NOTE — Transfer of Care (Signed)
Immediate Anesthesia Transfer of Care Note  Patient: Craig Anderson  Procedure(s) Performed: T6, T7 KYPHOPLASTY (N/A Back)  Patient Location: PACU  Anesthesia Type:General  Level of Consciousness: awake and alert   Airway & Oxygen Therapy: Patient Spontanous Breathing and Patient connected to face mask oxygen  Post-op Assessment: Report given to RN and Post -op Vital signs reviewed and stable  Post vital signs: Reviewed and stable  Last Vitals:  Vitals Value Taken Time  BP 136/91 05/16/19 1236  Temp 35.6 C 05/16/19 1236  Pulse 78 05/16/19 1239  Resp 12 05/16/19 1239  SpO2 100 % 05/16/19 1239  Vitals shown include unvalidated device data.  Last Pain:  Vitals:   05/16/19 1236  TempSrc:   PainSc: 0-No pain         Complications: No apparent anesthesia complications

## 2019-05-16 NOTE — Discharge Instructions (Addendum)
Remove Band-Aids on Saturday.  Okay to shower after that.  Take it easy today and tomorrow and resume more normal activities starting on Saturday.  Try to avoid bending over to the floor and lifting and try not to lift anything more than 5 pounds for 2 weeks.  Pain medicine as directed.  Call office if you are having problems.   AMBULATORY SURGERY  DISCHARGE INSTRUCTIONS   1) The drugs that you were given will stay in your system until tomorrow so for the next 24 hours you should not:  A) Drive an automobile B) Make any legal decisions C) Drink any alcoholic beverage   2) You may resume regular meals tomorrow.  Today it is better to start with liquids and gradually work up to solid foods.  You may eat anything you prefer, but it is better to start with liquids, then soup and crackers, and gradually work up to solid foods.   3) Please notify your doctor immediately if you have any unusual bleeding, trouble breathing, redness and pain at the surgery site, drainage, fever, or pain not relieved by medication.    4) Additional Instructions:        Please contact your physician with any problems or Same Day Surgery at [phone], Monday through Friday 6 am to 4 pm, or [place] [facility] number at [phone].

## 2019-05-16 NOTE — Anesthesia Postprocedure Evaluation (Signed)
Anesthesia Post Note  Patient: Craig Anderson  Procedure(s) Performed: T6, T7 KYPHOPLASTY (N/A Back)  Patient location during evaluation: PACU Anesthesia Type: General Level of consciousness: awake and alert Pain management: pain level controlled Vital Signs Assessment: post-procedure vital signs reviewed and stable Respiratory status: spontaneous breathing and respiratory function stable Cardiovascular status: stable Anesthetic complications: no     Last Vitals:  Vitals:   05/16/19 1306 05/16/19 1314  BP: 129/84 (!) 142/95  Pulse: 72   Resp: 12 18  Temp:  (!) 35.7 C  SpO2: 99% 99%    Last Pain:  Vitals:   05/16/19 1314  TempSrc: Tympanic  PainSc: 3                  Brasen Bundren K

## 2019-05-16 NOTE — Anesthesia Procedure Notes (Signed)
Date/Time: 05/16/2019 11:45 AM Performed by: Gunnar Fusi, MD Oxygen Delivery Method: Simple face mask Induction Type: IV induction

## 2019-05-16 NOTE — Op Note (Signed)
Date  05/16/2019  Time  12:32 pm   PATIENT: Craig Anderson   PRE-OPERATIVE DIAGNOSIS:  closed wedge compression fracture of T6 and T7   POST-OPERATIVE DIAGNOSIS:  closed wedge compression fracture of T6 and T7   PROCEDURE:  Procedure(s): KYPHOPLASTY T6 and T7  SURGEON: Laurene Footman, MD   ASSISTANTS: None   ANESTHESIA:   local and MAC   EBL:  No intake/output data recorded.   BLOOD ADMINISTERED:none   DRAINS: none    LOCAL MEDICATIONS USED:  MARCAINE    and XYLOCAINE    SPECIMEN:   Vertebral body biopsy at T6 and T7   DISPOSITION OF SPECIMEN:  Pathology   COUNTS:  YES   TOURNIQUET:  * No tourniquets in log *   IMPLANTS: Bone cement   DICTATION: .Dragon Dictation  patient was brought to the operating room and after adequate anesthesia was obtained the patient was placed prone.  C arm was brought in in good visualization of the affected level obtained on both AP and lateral projections.  After patient identification and timeout procedures were completed, local anesthetic was infiltrated with 10 cc 1% Xylocaine infiltrated subcutaneously.  This is done the area on the right side of the planned approach at T6 and on the left at T7.  The back was then prepped and draped in the usual sterile manner and repeat timeout procedure carried out.  A spinal needle was brought down to the pedicle on the right side of  T6 and the left side of T7 and a 50-50 mix of 1% Xylocaine half percent Sensorcaine with epinephrine total of 20 cc injected.  After allowing this to set a small incision was made and the trocar was advanced into the vertebral body in an extrapedicular fashion.  Biopsy was obtained at each level Drilling was carried out balloon inserted with inflation to  1-1/2 cc at each level.  When the cement was appropriate consistency 3 cc at T6 and 2-1/2 at T7 were injected into the vertebral body without extravasation, good fill superior to inferior endplates and from right to left  sides along the inferior endplate.  After the cement had set the trochar was removed and permanent C-arm views obtained.  The wound was closed with Dermabond followed by Band-Aid   PLAN OF CARE: Discharge to home after PACU   PATIENT DISPOSITION:  PACU - hemodynamically stable.

## 2019-05-17 LAB — SURGICAL PATHOLOGY

## 2019-05-22 DIAGNOSIS — F17210 Nicotine dependence, cigarettes, uncomplicated: Secondary | ICD-10-CM | POA: Diagnosis not present

## 2019-05-22 DIAGNOSIS — D696 Thrombocytopenia, unspecified: Secondary | ICD-10-CM | POA: Diagnosis not present

## 2019-05-22 DIAGNOSIS — J439 Emphysema, unspecified: Secondary | ICD-10-CM | POA: Diagnosis not present

## 2019-05-22 DIAGNOSIS — Z79899 Other long term (current) drug therapy: Secondary | ICD-10-CM | POA: Diagnosis not present

## 2019-05-22 DIAGNOSIS — E119 Type 2 diabetes mellitus without complications: Secondary | ICD-10-CM | POA: Diagnosis not present

## 2019-05-22 DIAGNOSIS — Z7984 Long term (current) use of oral hypoglycemic drugs: Secondary | ICD-10-CM | POA: Diagnosis not present

## 2019-05-22 DIAGNOSIS — R1030 Lower abdominal pain, unspecified: Secondary | ICD-10-CM | POA: Diagnosis not present

## 2019-05-22 DIAGNOSIS — C189 Malignant neoplasm of colon, unspecified: Secondary | ICD-10-CM | POA: Diagnosis not present

## 2019-05-22 DIAGNOSIS — F439 Reaction to severe stress, unspecified: Secondary | ICD-10-CM | POA: Diagnosis not present

## 2019-05-22 DIAGNOSIS — C786 Secondary malignant neoplasm of retroperitoneum and peritoneum: Secondary | ICD-10-CM | POA: Diagnosis not present

## 2019-05-31 DIAGNOSIS — S22000D Wedge compression fracture of unspecified thoracic vertebra, subsequent encounter for fracture with routine healing: Secondary | ICD-10-CM | POA: Diagnosis not present

## 2019-06-03 DIAGNOSIS — M8588 Other specified disorders of bone density and structure, other site: Secondary | ICD-10-CM | POA: Diagnosis not present

## 2019-06-05 DIAGNOSIS — Z733 Stress, not elsewhere classified: Secondary | ICD-10-CM | POA: Diagnosis not present

## 2019-06-05 DIAGNOSIS — Z7984 Long term (current) use of oral hypoglycemic drugs: Secondary | ICD-10-CM | POA: Diagnosis not present

## 2019-06-05 DIAGNOSIS — Z5112 Encounter for antineoplastic immunotherapy: Secondary | ICD-10-CM | POA: Diagnosis not present

## 2019-06-05 DIAGNOSIS — C189 Malignant neoplasm of colon, unspecified: Secondary | ICD-10-CM | POA: Diagnosis not present

## 2019-06-05 DIAGNOSIS — Z9889 Other specified postprocedural states: Secondary | ICD-10-CM | POA: Diagnosis not present

## 2019-06-05 DIAGNOSIS — Z79899 Other long term (current) drug therapy: Secondary | ICD-10-CM | POA: Diagnosis not present

## 2019-06-05 DIAGNOSIS — M546 Pain in thoracic spine: Secondary | ICD-10-CM | POA: Diagnosis not present

## 2019-06-05 DIAGNOSIS — Z5111 Encounter for antineoplastic chemotherapy: Secondary | ICD-10-CM | POA: Diagnosis not present

## 2019-06-05 DIAGNOSIS — E119 Type 2 diabetes mellitus without complications: Secondary | ICD-10-CM | POA: Diagnosis not present

## 2019-06-05 DIAGNOSIS — F17210 Nicotine dependence, cigarettes, uncomplicated: Secondary | ICD-10-CM | POA: Diagnosis not present

## 2019-06-05 DIAGNOSIS — R1030 Lower abdominal pain, unspecified: Secondary | ICD-10-CM | POA: Diagnosis not present

## 2019-06-05 DIAGNOSIS — C786 Secondary malignant neoplasm of retroperitoneum and peritoneum: Secondary | ICD-10-CM | POA: Diagnosis not present

## 2019-06-19 DIAGNOSIS — F17210 Nicotine dependence, cigarettes, uncomplicated: Secondary | ICD-10-CM | POA: Diagnosis not present

## 2019-06-19 DIAGNOSIS — R5383 Other fatigue: Secondary | ICD-10-CM | POA: Diagnosis not present

## 2019-06-19 DIAGNOSIS — E119 Type 2 diabetes mellitus without complications: Secondary | ICD-10-CM | POA: Diagnosis not present

## 2019-06-19 DIAGNOSIS — R1030 Lower abdominal pain, unspecified: Secondary | ICD-10-CM | POA: Diagnosis not present

## 2019-06-19 DIAGNOSIS — Z5112 Encounter for antineoplastic immunotherapy: Secondary | ICD-10-CM | POA: Diagnosis not present

## 2019-06-19 DIAGNOSIS — Z8781 Personal history of (healed) traumatic fracture: Secondary | ICD-10-CM | POA: Diagnosis not present

## 2019-06-19 DIAGNOSIS — D696 Thrombocytopenia, unspecified: Secondary | ICD-10-CM | POA: Diagnosis not present

## 2019-06-19 DIAGNOSIS — Z5111 Encounter for antineoplastic chemotherapy: Secondary | ICD-10-CM | POA: Diagnosis not present

## 2019-06-19 DIAGNOSIS — C189 Malignant neoplasm of colon, unspecified: Secondary | ICD-10-CM | POA: Diagnosis not present

## 2019-06-19 DIAGNOSIS — C786 Secondary malignant neoplasm of retroperitoneum and peritoneum: Secondary | ICD-10-CM | POA: Diagnosis not present

## 2019-06-19 DIAGNOSIS — C19 Malignant neoplasm of rectosigmoid junction: Secondary | ICD-10-CM | POA: Diagnosis not present

## 2019-07-03 DIAGNOSIS — C189 Malignant neoplasm of colon, unspecified: Secondary | ICD-10-CM | POA: Diagnosis not present

## 2019-07-17 DIAGNOSIS — R252 Cramp and spasm: Secondary | ICD-10-CM | POA: Diagnosis not present

## 2019-07-17 DIAGNOSIS — R200 Anesthesia of skin: Secondary | ICD-10-CM | POA: Diagnosis not present

## 2019-07-17 DIAGNOSIS — Z7984 Long term (current) use of oral hypoglycemic drugs: Secondary | ICD-10-CM | POA: Diagnosis not present

## 2019-07-17 DIAGNOSIS — C786 Secondary malignant neoplasm of retroperitoneum and peritoneum: Secondary | ICD-10-CM | POA: Diagnosis not present

## 2019-07-17 DIAGNOSIS — G629 Polyneuropathy, unspecified: Secondary | ICD-10-CM | POA: Diagnosis not present

## 2019-07-17 DIAGNOSIS — C189 Malignant neoplasm of colon, unspecified: Secondary | ICD-10-CM | POA: Diagnosis not present

## 2019-07-17 DIAGNOSIS — R1030 Lower abdominal pain, unspecified: Secondary | ICD-10-CM | POA: Diagnosis not present

## 2019-07-17 DIAGNOSIS — Z85828 Personal history of other malignant neoplasm of skin: Secondary | ICD-10-CM | POA: Diagnosis not present

## 2019-07-17 DIAGNOSIS — E119 Type 2 diabetes mellitus without complications: Secondary | ICD-10-CM | POA: Diagnosis not present

## 2019-07-17 DIAGNOSIS — Z5112 Encounter for antineoplastic immunotherapy: Secondary | ICD-10-CM | POA: Diagnosis not present

## 2019-07-17 DIAGNOSIS — R202 Paresthesia of skin: Secondary | ICD-10-CM | POA: Diagnosis not present

## 2019-07-17 DIAGNOSIS — F17210 Nicotine dependence, cigarettes, uncomplicated: Secondary | ICD-10-CM | POA: Diagnosis not present

## 2019-07-17 DIAGNOSIS — Z79899 Other long term (current) drug therapy: Secondary | ICD-10-CM | POA: Diagnosis not present

## 2019-07-19 DIAGNOSIS — C189 Malignant neoplasm of colon, unspecified: Secondary | ICD-10-CM | POA: Diagnosis not present

## 2019-07-23 DIAGNOSIS — E1165 Type 2 diabetes mellitus with hyperglycemia: Secondary | ICD-10-CM | POA: Diagnosis not present

## 2019-07-30 DIAGNOSIS — D510 Vitamin B12 deficiency anemia due to intrinsic factor deficiency: Secondary | ICD-10-CM | POA: Diagnosis not present

## 2019-07-30 DIAGNOSIS — F1911 Other psychoactive substance abuse, in remission: Secondary | ICD-10-CM | POA: Diagnosis not present

## 2019-07-30 DIAGNOSIS — E119 Type 2 diabetes mellitus without complications: Secondary | ICD-10-CM | POA: Diagnosis not present

## 2019-07-30 DIAGNOSIS — C187 Malignant neoplasm of sigmoid colon: Secondary | ICD-10-CM | POA: Diagnosis not present

## 2019-07-30 DIAGNOSIS — E1165 Type 2 diabetes mellitus with hyperglycemia: Secondary | ICD-10-CM | POA: Diagnosis not present

## 2019-07-30 DIAGNOSIS — C189 Malignant neoplasm of colon, unspecified: Secondary | ICD-10-CM | POA: Diagnosis not present

## 2019-07-31 DIAGNOSIS — C19 Malignant neoplasm of rectosigmoid junction: Secondary | ICD-10-CM | POA: Diagnosis not present

## 2019-07-31 DIAGNOSIS — F17210 Nicotine dependence, cigarettes, uncomplicated: Secondary | ICD-10-CM | POA: Diagnosis not present

## 2019-07-31 DIAGNOSIS — R1030 Lower abdominal pain, unspecified: Secondary | ICD-10-CM | POA: Diagnosis not present

## 2019-07-31 DIAGNOSIS — C189 Malignant neoplasm of colon, unspecified: Secondary | ICD-10-CM | POA: Diagnosis not present

## 2019-07-31 DIAGNOSIS — R3911 Hesitancy of micturition: Secondary | ICD-10-CM | POA: Diagnosis not present

## 2019-07-31 DIAGNOSIS — G629 Polyneuropathy, unspecified: Secondary | ICD-10-CM | POA: Diagnosis not present

## 2019-07-31 DIAGNOSIS — C786 Secondary malignant neoplasm of retroperitoneum and peritoneum: Secondary | ICD-10-CM | POA: Diagnosis not present

## 2019-07-31 DIAGNOSIS — R252 Cramp and spasm: Secondary | ICD-10-CM | POA: Diagnosis not present

## 2019-07-31 DIAGNOSIS — E119 Type 2 diabetes mellitus without complications: Secondary | ICD-10-CM | POA: Diagnosis not present

## 2019-07-31 DIAGNOSIS — Z5112 Encounter for antineoplastic immunotherapy: Secondary | ICD-10-CM | POA: Diagnosis not present

## 2019-08-14 DIAGNOSIS — C189 Malignant neoplasm of colon, unspecified: Secondary | ICD-10-CM | POA: Diagnosis not present

## 2019-08-14 DIAGNOSIS — Z5112 Encounter for antineoplastic immunotherapy: Secondary | ICD-10-CM | POA: Diagnosis not present

## 2019-08-14 DIAGNOSIS — F17210 Nicotine dependence, cigarettes, uncomplicated: Secondary | ICD-10-CM | POA: Diagnosis not present

## 2019-08-14 DIAGNOSIS — E119 Type 2 diabetes mellitus without complications: Secondary | ICD-10-CM | POA: Diagnosis not present

## 2019-08-14 DIAGNOSIS — C786 Secondary malignant neoplasm of retroperitoneum and peritoneum: Secondary | ICD-10-CM | POA: Diagnosis not present

## 2019-08-14 DIAGNOSIS — R971 Elevated cancer antigen 125 [CA 125]: Secondary | ICD-10-CM | POA: Diagnosis not present

## 2019-08-14 DIAGNOSIS — R1030 Lower abdominal pain, unspecified: Secondary | ICD-10-CM | POA: Diagnosis not present

## 2019-08-14 DIAGNOSIS — R5383 Other fatigue: Secondary | ICD-10-CM | POA: Diagnosis not present

## 2019-08-14 DIAGNOSIS — C19 Malignant neoplasm of rectosigmoid junction: Secondary | ICD-10-CM | POA: Diagnosis not present

## 2019-08-14 DIAGNOSIS — G629 Polyneuropathy, unspecified: Secondary | ICD-10-CM | POA: Diagnosis not present

## 2019-08-27 DIAGNOSIS — E119 Type 2 diabetes mellitus without complications: Secondary | ICD-10-CM | POA: Diagnosis not present

## 2019-08-28 DIAGNOSIS — C786 Secondary malignant neoplasm of retroperitoneum and peritoneum: Secondary | ICD-10-CM | POA: Diagnosis not present

## 2019-08-28 DIAGNOSIS — R1030 Lower abdominal pain, unspecified: Secondary | ICD-10-CM | POA: Diagnosis not present

## 2019-08-28 DIAGNOSIS — Z5112 Encounter for antineoplastic immunotherapy: Secondary | ICD-10-CM | POA: Diagnosis not present

## 2019-08-28 DIAGNOSIS — F17210 Nicotine dependence, cigarettes, uncomplicated: Secondary | ICD-10-CM | POA: Diagnosis not present

## 2019-08-28 DIAGNOSIS — E119 Type 2 diabetes mellitus without complications: Secondary | ICD-10-CM | POA: Diagnosis not present

## 2019-08-28 DIAGNOSIS — G629 Polyneuropathy, unspecified: Secondary | ICD-10-CM | POA: Diagnosis not present

## 2019-08-28 DIAGNOSIS — R5383 Other fatigue: Secondary | ICD-10-CM | POA: Diagnosis not present

## 2019-08-28 DIAGNOSIS — C19 Malignant neoplasm of rectosigmoid junction: Secondary | ICD-10-CM | POA: Diagnosis not present

## 2019-08-28 DIAGNOSIS — G893 Neoplasm related pain (acute) (chronic): Secondary | ICD-10-CM | POA: Diagnosis not present

## 2019-08-28 DIAGNOSIS — R7309 Other abnormal glucose: Secondary | ICD-10-CM | POA: Diagnosis not present

## 2019-08-28 DIAGNOSIS — C189 Malignant neoplasm of colon, unspecified: Secondary | ICD-10-CM | POA: Diagnosis not present

## 2020-02-23 IMAGING — XA DG THORACIC SPINE 2V
1 series · 1 of 1 positions shown · non-contrast
Comparison: May 01, 2019.

CLINICAL DATA: Status post kyphoplasty.

EXAM:
THORACIC SPINE 2 VIEWS; DG C-ARM 1-60 MIN
Radiation exposure index: 12.3 mGy.

[Series 2: ortho standard · 1 of 1 slices shown]
[im 1/1]
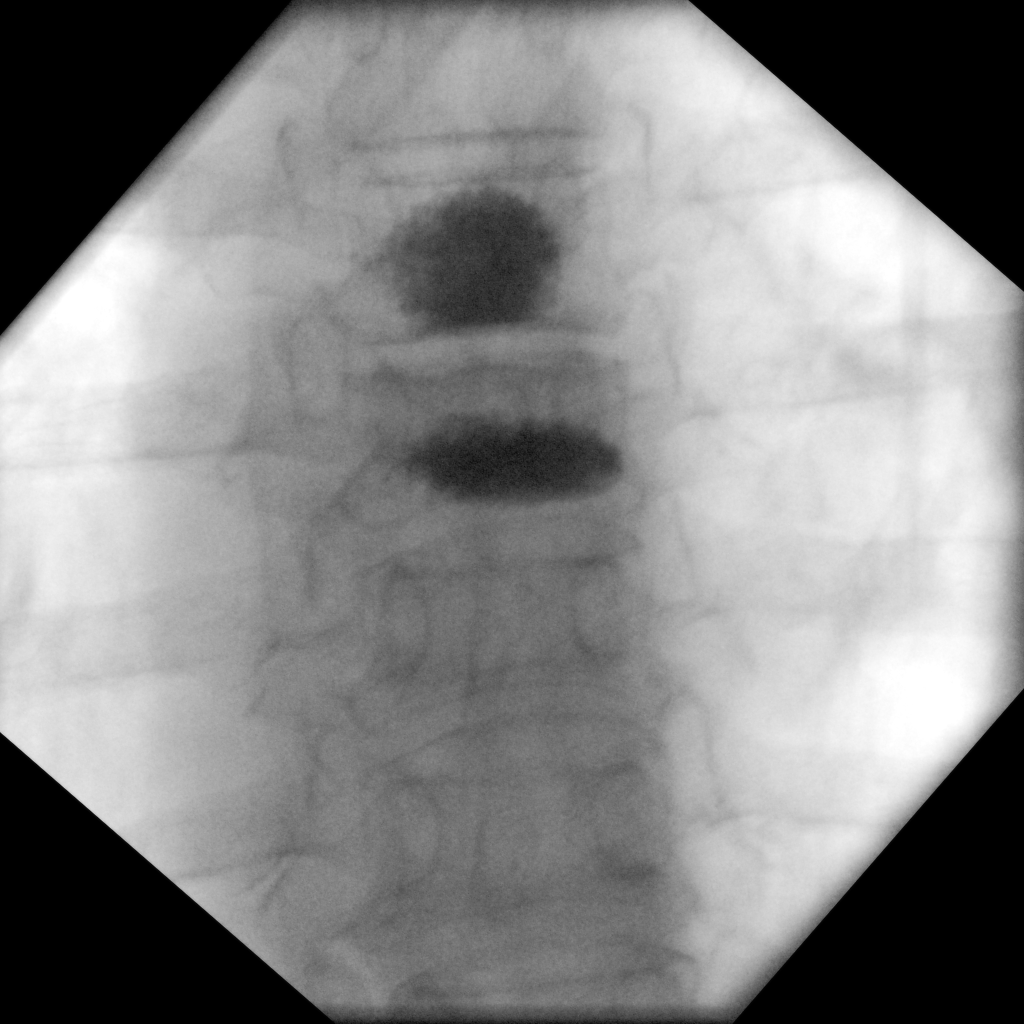

[1 of 1 positions shown; findings below may reference images not displayed]

FINDINGS: Two intraoperative fluoroscopic images were obtained of the
midthoracic spine. These demonstrate the patient be status post
kyphoplasty of the T6 and T7 vertebral bodies.
IMPRESSION: Fluoroscopic guidance provided during thoracic kyphoplasty at 2
levels.
